# Patient Record
Sex: Female | Born: 1985
Health system: Southern US, Community
[De-identification: ages and names within clinical notes are randomized; demographics above are authoritative.]

## PROBLEM LIST (undated history)

## (undated) DIAGNOSIS — F419 Anxiety disorder, unspecified: Secondary | ICD-10-CM

## (undated) DIAGNOSIS — D509 Iron deficiency anemia, unspecified: Secondary | ICD-10-CM

## (undated) DIAGNOSIS — D649 Anemia, unspecified: Secondary | ICD-10-CM

## (undated) HISTORY — DX: Iron deficiency anemia, unspecified: D50.9

## (undated) HISTORY — PX: MOUTH SURGERY: SHX715

## (undated) HISTORY — DX: Anxiety disorder, unspecified: F41.9

---
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
## 2006-06-09 ENCOUNTER — Other Ambulatory Visit: Admission: RE | Admit: 2006-06-09 | Discharge: 2006-06-09 | Payer: Self-pay | Admitting: Gynecology

## 2007-06-04 LAB — CONVERTED CEMR LAB: Pap Smear: NORMAL

## 2007-06-24 ENCOUNTER — Other Ambulatory Visit: Admission: RE | Admit: 2007-06-24 | Discharge: 2007-06-24 | Payer: Self-pay | Admitting: Gynecology

## 2007-07-30 ENCOUNTER — Ambulatory Visit: Payer: Self-pay | Admitting: Family Medicine

## 2007-07-30 DIAGNOSIS — R51 Headache: Secondary | ICD-10-CM | POA: Insufficient documentation

## 2007-07-30 DIAGNOSIS — R519 Headache, unspecified: Secondary | ICD-10-CM | POA: Insufficient documentation

## 2007-07-30 DIAGNOSIS — J309 Allergic rhinitis, unspecified: Secondary | ICD-10-CM | POA: Insufficient documentation

## 2007-07-30 DIAGNOSIS — D509 Iron deficiency anemia, unspecified: Secondary | ICD-10-CM | POA: Insufficient documentation

## 2007-07-30 HISTORY — DX: Iron deficiency anemia, unspecified: D50.9

## 2007-08-30 ENCOUNTER — Ambulatory Visit: Payer: Self-pay | Admitting: Family Medicine

## 2007-08-30 DIAGNOSIS — G43009 Migraine without aura, not intractable, without status migrainosus: Secondary | ICD-10-CM | POA: Insufficient documentation

## 2011-12-16 ENCOUNTER — Encounter: Payer: Self-pay | Admitting: Obstetrics & Gynecology

## 2012-01-02 ENCOUNTER — Encounter: Payer: Self-pay | Admitting: Obstetrics & Gynecology

## 2012-05-08 ENCOUNTER — Observation Stay: Payer: Self-pay | Admitting: Obstetrics and Gynecology

## 2012-05-31 ENCOUNTER — Inpatient Hospital Stay: Payer: Self-pay

## 2012-05-31 LAB — CBC WITH DIFFERENTIAL/PLATELET
Eosinophil %: 0.9 %
HGB: 11.8 g/dL — ABNORMAL LOW (ref 12.0–16.0)
Lymphocyte #: 2.1 10*3/uL (ref 1.0–3.6)
Lymphocyte %: 17.5 %
Monocyte %: 7.5 %
Neutrophil %: 73.8 %
Platelet: 237 10*3/uL (ref 150–440)
RBC: 3.75 10*6/uL — ABNORMAL LOW (ref 3.80–5.20)
WBC: 11.9 10*3/uL — ABNORMAL HIGH (ref 3.6–11.0)

## 2014-01-12 ENCOUNTER — Observation Stay: Payer: Self-pay | Admitting: Obstetrics & Gynecology

## 2014-01-14 ENCOUNTER — Inpatient Hospital Stay: Payer: Self-pay

## 2014-01-14 LAB — CBC WITH DIFFERENTIAL/PLATELET
BASOS PCT: 0.2 %
Basophil #: 0 10*3/uL (ref 0.0–0.1)
EOS PCT: 0.8 %
Eosinophil #: 0.1 10*3/uL (ref 0.0–0.7)
HCT: 35.6 % (ref 35.0–47.0)
HGB: 11.9 g/dL — AB (ref 12.0–16.0)
LYMPHS PCT: 20.3 %
Lymphocyte #: 2.4 10*3/uL (ref 1.0–3.6)
MCH: 30 pg (ref 26.0–34.0)
MCHC: 33.6 g/dL (ref 32.0–36.0)
MCV: 89 fL (ref 80–100)
Monocyte #: 0.8 x10 3/mm (ref 0.2–0.9)
Monocyte %: 6.8 %
NEUTROS ABS: 8.6 10*3/uL — AB (ref 1.4–6.5)
Neutrophil %: 71.9 %
PLATELETS: 197 10*3/uL (ref 150–440)
RBC: 3.98 10*6/uL (ref 3.80–5.20)
RDW: 13.1 % (ref 11.5–14.5)
WBC: 12 10*3/uL — ABNORMAL HIGH (ref 3.6–11.0)

## 2014-01-15 LAB — HEMATOCRIT: HCT: 29.4 % — AB (ref 35.0–47.0)

## 2015-03-13 NOTE — H&P (Signed)
L&D Evaluation:  History Expanded:  HPI 29 yo G1P0 at 32 6/7 weeks w question of LOF, either spontaneous rupture of membranes or urinary incointinence.  No contractions or vaginal bleeding.   Presents with leaking fluid   Patient's Medical History No Chronic Illness   Patient's Surgical History none   Medications Pre Natal Vitamins   Allergies NKDA   Social History none   Family History Non-Contributory   ROS:  ROS All systems were reviewed.  HEENT, CNS, GI, GU, Respiratory, CV, Renal and Musculoskeletal systems were found to be normal.   Exam:  Vital Signs stable   General no apparent distress   Mental Status clear   Abdomen gravid, non-tender   Estimated Fetal Weight Average for gestational age   Edema no edema   Pelvic no external lesions, 3 cm.  Neg pool and Nitrazine   FHT normal rate with no decels   Ucx absent   Impression:  Impression Urinary incointinence most likely.   Plan:  Plan EFM/NST, Will monitor for leakage, contractions, labor.   Follow Up Appointment already scheduled   Electronic Signatures: Hoyt Koch (MD)  (Signed 12-Mar-15 21:58)  Authored: L&D Evaluation   Last Updated: 12-Mar-15 21:58 by Hoyt Koch (MD)

## 2015-03-13 NOTE — H&P (Signed)
L&D Evaluation:  History:   HPI 29 year old G1P0 with EDC=05/25/2012 by LMP=08/19/2011 presents to L&D at 40 6/7 weeks for IOL for postdates. Has occasional contraction and good FM. Denies LOF or VB. Prenatal care at [facility] remarkable for early [facility], 7wk ultrasound confirming dates, first trimester spotting, normal anatomy scan, remarkable pedal/ankle edema, and 50 # weight gain with pregnancy. [place] 7/26 estimates fetal weight at 4171 Gms and AFI=16 cm. 1hr GTT was 101. NSTs have been reactive. LABS: O POS, RI, VI, GBS negative.    Presents with IOL    Patient's Medical History No Chronic Illness    Patient's Surgical History none    Medications Pre Natal Vitamins    Allergies Sulfa, rash    Social History none    Family History Non-Contributory   ROS:   ROS All systems were reviewed.  HEENT, CNS, GI, GU, Respiratory, CV, Renal and Musculoskeletal systems were found to be normal.   Exam:   Vital Signs stable    Urine Protein not completed    General no apparent distress    Mental Status clear    Chest clear    Heart normal sinus rhythm, no murmur/gallop/rubs    Abdomen gravid, non-tender    Estimated Fetal Weight Large for gestational age, 9#    Fetal Position vtx    Edema 2+    Reflexes 2+    Pelvic 1/60-70%/-1/post/soft/friable    Mebranes Intact    FHT normal rate with no decels, 135-140 with accels to 150s to 160s, mod variability    Fetal Heart Rate 144    Ucx mild, frequent of varying strength and duration    Skin dry   Impression:   Impression IUP at 40 6/7 weeks for IOL   Plan:   Plan EFM/NST, monitor contractions and for cervical change, Cervidil ripening tonight, pitocin tomorrow. Discussed POM and risks of continued observation and risks of induction which include hyperstimulation, FITL, C-section, and failed induction. Patient wishes to proceed with POM.   Electronic Signatures: Karene Fry (CNM)  (Signed 29-Jul-13 21:40)  Authored:  L&D Evaluation   Last Updated: 29-Jul-13 21:40 by Karene Fry (CNM)

## 2015-03-13 NOTE — H&P (Signed)
L&D Evaluation:  History Expanded:  HPI 29 yo G2P1001 at 40 1/7 weeks in labor at 6 cms, she is GBS POS. No vaginal bleeding. NO ROM, O pos RUBI/VZI tdap goven 11/25/10 wants to BF and desires Mirena post delivery   Gravida 2   Term 1   PreTerm 0   Abortion 0   Living 1   Blood Type (Maternal) O positive   Group B Strep Results Maternal (Result >5wks must be treated as unknown) positive   Maternal HIV Negative   Maternal Syphilis Ab Nonreactive   Maternal Varicella Immune   Rubella Results (Maternal) immune   Maternal T-Dap Immune   [facility] 13-Jan-2014   Presents with contractions   Patient's Medical History No Chronic Illness   Patient's Surgical History none   Medications Pre Natal Vitamins   Allergies NKDA   Social History none   Family History Non-Contributory   ROS:  ROS All systems were reviewed.  HEENT, CNS, GI, GU, Respiratory, CV, Renal and Musculoskeletal systems were found to be normal.   Exam:  Vital Signs stable   General no apparent distress   Mental Status clear   Chest clear   Heart normal sinus rhythm   Abdomen gravid, non-tender   Estimated Fetal Weight Average for gestational age   Fetal Position v   Fundal Height term   Back no CVAT   Edema no edema   Reflexes 1+   Pelvic no external lesions, 3 cm.  Neg pool and Nitrazine   Mebranes Intact   FHT normal rate with no decels, CAT 1   Ucx regular   Ucx Frequency 3 min   Skin dry   Lymph no lymphadenopathy   Impression:  Impression active labor, Urinary incointinence most likely.   Plan:  Plan EFM/NST, antibiotics for GBBS prophylaxis, admit for labor   Follow Up Appointment already scheduled   Electronic Signatures: Erik Obey (MD)  (Signed 14-Mar-15 05:11)  Authored: L&D Evaluation   Last Updated: 14-Mar-15 05:11 by Erik Obey (MD)

## 2015-06-06 ENCOUNTER — Other Ambulatory Visit (HOSPITAL_COMMUNITY): Payer: Self-pay | Admitting: Obstetrics and Gynecology

## 2015-06-06 DIAGNOSIS — Z975 Presence of (intrauterine) contraceptive device: Secondary | ICD-10-CM

## 2015-06-06 DIAGNOSIS — R1011 Right upper quadrant pain: Secondary | ICD-10-CM

## 2015-06-14 ENCOUNTER — Ambulatory Visit (HOSPITAL_COMMUNITY)
Admission: RE | Admit: 2015-06-14 | Discharge: 2015-06-14 | Disposition: A | Discharge status: Home / Self Care | Payer: Managed Care, Other (non HMO) | Source: Ambulatory Visit | Attending: Obstetrics and Gynecology | Admitting: Obstetrics and Gynecology

## 2015-06-14 ENCOUNTER — Other Ambulatory Visit (HOSPITAL_COMMUNITY): Payer: Self-pay | Admitting: Obstetrics and Gynecology

## 2015-06-14 DIAGNOSIS — R1011 Right upper quadrant pain: Secondary | ICD-10-CM

## 2015-06-14 DIAGNOSIS — K8020 Calculus of gallbladder without cholecystitis without obstruction: Secondary | ICD-10-CM | POA: Diagnosis not present

## 2015-06-14 DIAGNOSIS — Z30431 Encounter for routine checking of intrauterine contraceptive device: Secondary | ICD-10-CM | POA: Insufficient documentation

## 2015-06-14 DIAGNOSIS — Z975 Presence of (intrauterine) contraceptive device: Secondary | ICD-10-CM

## 2015-06-19 ENCOUNTER — Ambulatory Visit (HOSPITAL_COMMUNITY): Payer: Self-pay

## 2015-06-22 ENCOUNTER — Ambulatory Visit (HOSPITAL_COMMUNITY): Payer: Self-pay

## 2015-07-11 ENCOUNTER — Other Ambulatory Visit: Payer: Self-pay | Admitting: General Surgery

## 2015-08-24 NOTE — Pre-Procedure Instructions (Addendum)
Sarah Lyons  08/24/2015      [id number] [place], [place] - [address] [place] [address] Phone: [phone] Fax: [phone]    Your procedure is scheduled on Wednesday, September 05, 2015  [facility] to [facility] Admitting at 6:30 A.M.  Call this number if you have problems the morning of surgery:  [phone]   Remember:  Do not eat food or drink liquids after midnight Tuesday, September 04, 2015  Take these medicines the morning of surgery with A SIP OF WATER: none  Stop taking Aspirin, vitamins and herbal medications such as Fish oil. Do not take any NSAIDs ie: Ibuprofen, Advil, Naproxen or any medication containing Aspirin; stop Wednesday, August 31, 2015   Do not wear jewelry, make-up or nail polish.  Do not wear lotions, powders, or perfumes.  You may not wear deodorant.  Do not shave 48 hours prior to surgery.   Do not bring valuables to the hospital.  [facility] is not responsible for any belongings or valuables.  Contacts, dentures or bridgework may not be worn into surgery.  Leave your suitcase in the car.  After surgery it may be brought to your room.  For patients admitted to the hospital, discharge time will be determined by your treatment team.  Patients discharged the day of surgery will not be allowed to drive home.   Name and phone number of your driver:    Special instructions:  [facility] - Preparing for Surgery  Before surgery, you can play an important role.  Because skin is not sterile, your skin needs to be as free of germs as possible.  You can reduce the number of germs on you skin by washing with CHG (chlorahexidine gluconate) soap before surgery.  CHG is an antiseptic cleaner which kills germs and bonds with the skin to continue killing germs even after washing.  Please DO NOT use if you have an allergy to CHG or antibacterial soaps.  If your skin becomes reddened/irritated stop using the CHG and  inform your nurse when you arrive at Short Stay.  Do not shave (including legs and underarms) for at least 48 hours prior to the first CHG shower.  You may shave your face.  Please follow these instructions carefully:   1.  Shower with CHG Soap the night before surgery and the morning of Surgery.  2.  If you choose to wash your hair, wash your hair first as usual with your normal shampoo.  3.  After you shampoo, rinse your hair and body thoroughly to remove the Shampoo.  4.  Use CHG as you would any other liquid soap.  You can apply chg directly  to the skin and wash gently with scrungie or a clean washcloth.  5.  Apply the CHG Soap to your body ONLY FROM THE NECK DOWN.  Do not use on open wounds or open sores.  Avoid contact with your eyes, ears, mouth and genitals (private parts).  Wash genitals (private parts) with your normal soap.  6.  Wash thoroughly, paying special attention to the area where your surgery will be performed.  7.  Thoroughly rinse your body with warm water from the neck down.  8.  DO NOT shower/wash with your normal soap after using and rinsing off the CHG Soap.  9.  Pat yourself dry with a clean towel.            10.  Wear clean  pajamas.            11.  Place clean sheets on your bed the night of your first shower and do not sleep with pets.  Day of Surgery  Do not apply any lotions/deodorants the morning of surgery.  Please wear clean clothes to the hospital/surgery center.  Please read over the following fact sheets that you were given. Pain Booklet, Coughing and Deep Breathing and Surgical Site Infection Prevention

## 2015-08-27 ENCOUNTER — Encounter (HOSPITAL_COMMUNITY): Payer: Self-pay

## 2015-08-27 ENCOUNTER — Encounter (HOSPITAL_COMMUNITY)
Admission: RE | Admit: 2015-08-27 | Discharge: 2015-08-27 | Disposition: A | Discharge status: Home / Self Care | Payer: Managed Care, Other (non HMO) | Source: Ambulatory Visit | Attending: General Surgery | Admitting: General Surgery

## 2015-08-27 DIAGNOSIS — Z01812 Encounter for preprocedural laboratory examination: Secondary | ICD-10-CM | POA: Insufficient documentation

## 2015-08-27 DIAGNOSIS — K8020 Calculus of gallbladder without cholecystitis without obstruction: Secondary | ICD-10-CM | POA: Diagnosis not present

## 2015-08-27 HISTORY — DX: Anemia, unspecified: D64.9

## 2015-08-27 LAB — HCG, SERUM, QUALITATIVE: Preg, Serum: NEGATIVE

## 2015-08-27 LAB — COMPREHENSIVE METABOLIC PANEL
ALBUMIN: 4.4 g/dL (ref 3.5–5.0)
ALK PHOS: 48 U/L (ref 38–126)
ALT: 38 U/L (ref 14–54)
AST: 23 U/L (ref 15–41)
Anion gap: 8 (ref 5–15)
BILIRUBIN TOTAL: 0.9 mg/dL (ref 0.3–1.2)
BUN: 12 mg/dL (ref 6–20)
CO2: 23 mmol/L (ref 22–32)
CREATININE: 0.81 mg/dL (ref 0.44–1.00)
Calcium: 9.5 mg/dL (ref 8.9–10.3)
Chloride: 107 mmol/L (ref 101–111)
GFR calc Af Amer: >60 mL/min (ref >60)
GFR calc non Af Amer: >60 mL/min (ref >60)
Glucose, Bld: 92 mg/dL (ref 65–99)
Potassium: 4.4 mmol/L (ref 3.5–5.1)
Sodium: 138 mmol/L (ref 135–145)
TOTAL PROTEIN: 7.4 g/dL (ref 6.5–8.1)

## 2015-08-27 LAB — CBC
HEMATOCRIT: 40.7 % (ref 36.0–46.0)
Hemoglobin: 13.7 g/dL (ref 12.0–15.0)
MCH: 28.8 pg (ref 26.0–34.0)
MCHC: 33.7 g/dL (ref 30.0–36.0)
MCV: 85.7 fL (ref 78.0–100.0)
Platelets: 193 10*3/uL (ref 150–400)
RBC: 4.75 MIL/uL (ref 3.87–5.11)
RDW: 11.9 % (ref 11.5–15.5)
WBC: 5.4 10*3/uL (ref 4.0–10.5)

## 2015-09-04 MED ORDER — CHLORHEXIDINE GLUCONATE 4 % EX LIQD: 1.0000 "application " | Freq: Once | CUTANEOUS | Status: DC

## 2015-09-04 MED ORDER — CEFAZOLIN SODIUM-DEXTROSE 2-3 GM-% IV SOLR
2.0000 g | INTRAVENOUS | Status: AC
  Administered 2015-09-05: 2 g via INTRAVENOUS
  Filled 2015-09-04: qty 50

## 2015-09-05 ENCOUNTER — Ambulatory Visit (HOSPITAL_COMMUNITY): Payer: Managed Care, Other (non HMO)

## 2015-09-05 ENCOUNTER — Ambulatory Visit (HOSPITAL_COMMUNITY)
Admission: RE | Admit: 2015-09-05 | Discharge: 2015-09-06 | Disposition: A | Discharge status: Home / Self Care | Payer: Managed Care, Other (non HMO) | Source: Ambulatory Visit | Attending: General Surgery | Admitting: General Surgery

## 2015-09-05 ENCOUNTER — Encounter (HOSPITAL_COMMUNITY)
Admission: RE | Disposition: A | Discharge status: Home / Self Care | Payer: Self-pay | Source: Ambulatory Visit | Attending: General Surgery

## 2015-09-05 ENCOUNTER — Encounter (HOSPITAL_COMMUNITY): Payer: Self-pay | Admitting: Surgery

## 2015-09-05 ENCOUNTER — Ambulatory Visit (HOSPITAL_COMMUNITY): Payer: Managed Care, Other (non HMO) | Admitting: Certified Registered Nurse Anesthetist

## 2015-09-05 DIAGNOSIS — K8010 Calculus of gallbladder with chronic cholecystitis without obstruction: Secondary | ICD-10-CM | POA: Diagnosis not present

## 2015-09-05 DIAGNOSIS — K8020 Calculus of gallbladder without cholecystitis without obstruction: Secondary | ICD-10-CM

## 2015-09-05 HISTORY — PX: CHOLECYSTECTOMY: SHX55

## 2015-09-05 SURGERY — LAPAROSCOPIC CHOLECYSTECTOMY WITH INTRAOPERATIVE CHOLANGIOGRAM
Anesthesia: General | Site: Abdomen

## 2015-09-05 MED ORDER — GLYCOPYRROLATE 0.2 MG/ML IJ SOLN
INTRAMUSCULAR | Status: AC
  Filled 2015-09-05: qty 2

## 2015-09-05 MED ORDER — ONDANSETRON 4 MG PO TBDP: 4.0000 mg | ORAL_TABLET | Freq: Four times a day (QID) | ORAL | Status: DC | PRN

## 2015-09-05 MED ORDER — 0.9 % SODIUM CHLORIDE (POUR BTL) OPTIME
TOPICAL | Status: DC | PRN
  Administered 2015-09-05: 1000 mL

## 2015-09-05 MED ORDER — ROCURONIUM BROMIDE 50 MG/5ML IV SOLN
INTRAVENOUS | Status: AC
  Filled 2015-09-05: qty 1

## 2015-09-05 MED ORDER — EPHEDRINE SULFATE 50 MG/ML IJ SOLN
INTRAMUSCULAR | Status: AC
  Filled 2015-09-05: qty 1

## 2015-09-05 MED ORDER — ONDANSETRON HCL 4 MG/2ML IJ SOLN: 4.0000 mg | Freq: Once | INTRAMUSCULAR | Status: DC | PRN

## 2015-09-05 MED ORDER — OXYCODONE HCL 5 MG/5ML PO SOLN: 5.0000 mg | Freq: Once | ORAL | Status: DC | PRN

## 2015-09-05 MED ORDER — ONDANSETRON HCL 4 MG/2ML IJ SOLN
INTRAMUSCULAR | Status: DC | PRN
  Administered 2015-09-05: 4 mg via INTRAVENOUS

## 2015-09-05 MED ORDER — SODIUM CHLORIDE 0.9 % IR SOLN
Status: DC | PRN
  Administered 2015-09-05: 1000 mL

## 2015-09-05 MED ORDER — DEXAMETHASONE SODIUM PHOSPHATE 4 MG/ML IJ SOLN
INTRAMUSCULAR | Status: AC
  Filled 2015-09-05: qty 1

## 2015-09-05 MED ORDER — KCL IN DEXTROSE-NACL 20-5-0.9 MEQ/L-%-% IV SOLN
INTRAVENOUS | Status: DC
  Administered 2015-09-05: 14:00:00 via INTRAVENOUS
  Filled 2015-09-05 (×2): qty 1000

## 2015-09-05 MED ORDER — MIDAZOLAM HCL 5 MG/5ML IJ SOLN
INTRAMUSCULAR | Status: DC | PRN
  Administered 2015-09-05: 2 mg via INTRAVENOUS

## 2015-09-05 MED ORDER — DEXAMETHASONE SODIUM PHOSPHATE 4 MG/ML IJ SOLN
INTRAMUSCULAR | Status: DC | PRN
  Administered 2015-09-05: 4 mg via INTRAVENOUS

## 2015-09-05 MED ORDER — PHENYLEPHRINE 40 MCG/ML (10ML) SYRINGE FOR IV PUSH (FOR BLOOD PRESSURE SUPPORT)
PREFILLED_SYRINGE | INTRAVENOUS | Status: AC
  Filled 2015-09-05: qty 10

## 2015-09-05 MED ORDER — OXYCODONE-ACETAMINOPHEN 5-325 MG PO TABS
ORAL_TABLET | ORAL | Status: AC
  Administered 2015-09-05: 2 via ORAL
  Filled 2015-09-05: qty 2

## 2015-09-05 MED ORDER — PROPOFOL 10 MG/ML IV BOLUS
INTRAVENOUS | Status: DC | PRN
  Administered 2015-09-05: 200 mg via INTRAVENOUS

## 2015-09-05 MED ORDER — OXYCODONE-ACETAMINOPHEN 5-325 MG PO TABS
1.0000 | ORAL_TABLET | ORAL | Status: DC | PRN
  Administered 2015-09-05 – 2015-09-06 (×5): 2 via ORAL
  Filled 2015-09-05 (×4): qty 2

## 2015-09-05 MED ORDER — ONDANSETRON HCL 4 MG/2ML IJ SOLN
INTRAMUSCULAR | Status: AC
  Filled 2015-09-05: qty 2

## 2015-09-05 MED ORDER — ACETAMINOPHEN 160 MG/5ML PO SOLN
325.0000 mg | ORAL | Status: DC | PRN
  Filled 2015-09-05: qty 20.3

## 2015-09-05 MED ORDER — OXYCODONE HCL 5 MG PO TABS
ORAL_TABLET | ORAL | Status: AC
  Filled 2015-09-05: qty 1

## 2015-09-05 MED ORDER — NEOSTIGMINE METHYLSULFATE 10 MG/10ML IV SOLN
INTRAVENOUS | Status: DC | PRN
  Administered 2015-09-05: 3 mg via INTRAVENOUS

## 2015-09-05 MED ORDER — FENTANYL CITRATE (PF) 100 MCG/2ML IJ SOLN: 25.0000 ug | INTRAMUSCULAR | Status: DC | PRN

## 2015-09-05 MED ORDER — MIDAZOLAM HCL 2 MG/2ML IJ SOLN
INTRAMUSCULAR | Status: AC
  Filled 2015-09-05: qty 4

## 2015-09-05 MED ORDER — OXYCODONE HCL 5 MG PO TABS: 5.0000 mg | ORAL_TABLET | Freq: Once | ORAL | Status: DC | PRN

## 2015-09-05 MED ORDER — GLYCOPYRROLATE 0.2 MG/ML IJ SOLN
INTRAMUSCULAR | Status: DC | PRN
  Administered 2015-09-05: 0.4 mg via INTRAVENOUS

## 2015-09-05 MED ORDER — MORPHINE SULFATE (PF) 2 MG/ML IV SOLN
INTRAVENOUS | Status: AC
  Administered 2015-09-05: 2 mg via INTRAVENOUS
  Filled 2015-09-05: qty 1

## 2015-09-05 MED ORDER — ONDANSETRON HCL 4 MG/2ML IJ SOLN
4.0000 mg | Freq: Four times a day (QID) | INTRAMUSCULAR | Status: DC | PRN
  Administered 2015-09-05: 4 mg via INTRAVENOUS
  Filled 2015-09-05: qty 2

## 2015-09-05 MED ORDER — EPHEDRINE SULFATE 50 MG/ML IJ SOLN
INTRAMUSCULAR | Status: DC | PRN
  Administered 2015-09-05: 10 mg via INTRAVENOUS

## 2015-09-05 MED ORDER — LACTATED RINGERS IV SOLN
INTRAVENOUS | Status: DC | PRN
  Administered 2015-09-05: 08:00:00 via INTRAVENOUS

## 2015-09-05 MED ORDER — MORPHINE SULFATE (PF) 2 MG/ML IV SOLN
1.0000 mg | INTRAVENOUS | Status: DC | PRN
  Administered 2015-09-05 (×2): 2 mg via INTRAVENOUS
  Filled 2015-09-05: qty 1

## 2015-09-05 MED ORDER — BUPIVACAINE-EPINEPHRINE (PF) 0.25% -1:200000 IJ SOLN
INTRAMUSCULAR | Status: AC
  Filled 2015-09-05: qty 30

## 2015-09-05 MED ORDER — LIDOCAINE HCL (CARDIAC) 20 MG/ML IV SOLN
INTRAVENOUS | Status: DC | PRN
  Administered 2015-09-05: 40 mg via INTRAVENOUS
  Administered 2015-09-05: 60 mg via INTRAVENOUS

## 2015-09-05 MED ORDER — HYDROMORPHONE HCL 1 MG/ML IJ SOLN
0.2500 mg | INTRAMUSCULAR | Status: DC | PRN
  Administered 2015-09-05 (×4): 0.5 mg via INTRAVENOUS

## 2015-09-05 MED ORDER — HYDROMORPHONE HCL 1 MG/ML IJ SOLN
INTRAMUSCULAR | Status: AC
  Administered 2015-09-05: 0.5 mg via INTRAVENOUS
  Filled 2015-09-05: qty 1

## 2015-09-05 MED ORDER — OXYCODONE HCL 5 MG/5ML PO SOLN: 5.0000 mg | Freq: Once | ORAL | Status: AC | PRN

## 2015-09-05 MED ORDER — LIDOCAINE HCL (CARDIAC) 20 MG/ML IV SOLN
INTRAVENOUS | Status: AC
  Filled 2015-09-05: qty 5

## 2015-09-05 MED ORDER — BUPIVACAINE-EPINEPHRINE 0.25% -1:200000 IJ SOLN
INTRAMUSCULAR | Status: DC | PRN
  Administered 2015-09-05: 20 mL

## 2015-09-05 MED ORDER — HEPARIN SODIUM (PORCINE) 5000 UNIT/ML IJ SOLN
5000.0000 [IU] | Freq: Three times a day (TID) | INTRAMUSCULAR | Status: DC
  Administered 2015-09-06: 5000 [IU] via SUBCUTANEOUS
  Filled 2015-09-05: qty 1

## 2015-09-05 MED ORDER — ACETAMINOPHEN 325 MG PO TABS: 325.0000 mg | ORAL_TABLET | ORAL | Status: DC | PRN

## 2015-09-05 MED ORDER — SODIUM CHLORIDE 0.9 % IV SOLN
INTRAVENOUS | Status: DC | PRN
  Administered 2015-09-05: 2.5 mL

## 2015-09-05 MED ORDER — SODIUM CHLORIDE 0.9 % IJ SOLN
INTRAMUSCULAR | Status: AC
  Filled 2015-09-05: qty 10

## 2015-09-05 MED ORDER — FENTANYL CITRATE (PF) 100 MCG/2ML IJ SOLN
INTRAMUSCULAR | Status: DC | PRN
  Administered 2015-09-05: 50 ug via INTRAVENOUS
  Administered 2015-09-05: 100 ug via INTRAVENOUS

## 2015-09-05 MED ORDER — ROCURONIUM BROMIDE 100 MG/10ML IV SOLN
INTRAVENOUS | Status: DC | PRN
  Administered 2015-09-05: 40 mg via INTRAVENOUS

## 2015-09-05 MED ORDER — OXYCODONE HCL 5 MG PO TABS
5.0000 mg | ORAL_TABLET | Freq: Once | ORAL | Status: AC | PRN
  Administered 2015-09-05: 5 mg via ORAL

## 2015-09-05 MED ORDER — FENTANYL CITRATE (PF) 250 MCG/5ML IJ SOLN
INTRAMUSCULAR | Status: AC
  Filled 2015-09-05: qty 5

## 2015-09-05 MED ORDER — OXYCODONE-ACETAMINOPHEN 5-325 MG PO TABS: 1.0000 | ORAL_TABLET | ORAL | Status: DC | PRN

## 2015-09-05 MED ORDER — PANTOPRAZOLE SODIUM 40 MG IV SOLR
40.0000 mg | Freq: Every day | INTRAVENOUS | Status: DC
  Administered 2015-09-05: 40 mg via INTRAVENOUS
  Filled 2015-09-05: qty 40

## 2015-09-05 MED ORDER — SUCCINYLCHOLINE CHLORIDE 20 MG/ML IJ SOLN
INTRAMUSCULAR | Status: AC
  Filled 2015-09-05: qty 1

## 2015-09-05 MED ORDER — PROPOFOL 10 MG/ML IV BOLUS
INTRAVENOUS | Status: AC
  Filled 2015-09-05: qty 20

## 2015-09-05 MED ORDER — NEOSTIGMINE METHYLSULFATE 10 MG/10ML IV SOLN
INTRAVENOUS | Status: AC
  Filled 2015-09-05: qty 1

## 2015-09-05 SURGICAL SUPPLY — 36 items
APPLIER CLIP 5 13 M/L LIGAMAX5 (MISCELLANEOUS) ×2
APR CLP MED LRG 5 ANG JAW (MISCELLANEOUS) ×1
BAG SPEC RTRVL LRG 6X4 10 (ENDOMECHANICALS) ×1
BLADE SURG ROTATE 9660 (MISCELLANEOUS) IMPLANT
CANISTER SUCTION 2500CC (MISCELLANEOUS) ×2 IMPLANT
CATH REDDICK CHOLANGI 4FR 50CM (CATHETERS) ×2 IMPLANT
CHLORAPREP W/TINT 26ML (MISCELLANEOUS) ×2 IMPLANT
CLIP APPLIE 5 13 M/L LIGAMAX5 (MISCELLANEOUS) ×1 IMPLANT
COVER MAYO STAND STRL (DRAPES) ×2 IMPLANT
COVER SURGICAL LIGHT HANDLE (MISCELLANEOUS) ×2 IMPLANT
DRAPE C-ARM 42X72 X-RAY (DRAPES) ×2 IMPLANT
ELECT REM PT RETURN 9FT ADLT (ELECTROSURGICAL) ×2
ELECTRODE REM PT RTRN 9FT ADLT (ELECTROSURGICAL) ×1 IMPLANT
GLOVE BIO SURGEON STRL SZ7.5 (GLOVE) ×3 IMPLANT
GLOVE BIOGEL PI IND STRL 7.5 (GLOVE) IMPLANT
GLOVE BIOGEL PI INDICATOR 7.5 (GLOVE) ×1
GOWN STRL REUS W/ TWL LRG LVL3 (GOWN DISPOSABLE) ×3 IMPLANT
GOWN STRL REUS W/TWL LRG LVL3 (GOWN DISPOSABLE) ×6
IV CATH 14GX2 1/4 (CATHETERS) ×2 IMPLANT
KIT BASIN OR (CUSTOM PROCEDURE TRAY) ×2 IMPLANT
KIT ROOM TURNOVER OR (KITS) ×2 IMPLANT
LIQUID BAND (GAUZE/BANDAGES/DRESSINGS) ×2 IMPLANT
NS IRRIG 1000ML POUR BTL (IV SOLUTION) ×2 IMPLANT
PAD ARMBOARD 7.5X6 YLW CONV (MISCELLANEOUS) ×2 IMPLANT
POUCH SPECIMEN RETRIEVAL 10MM (ENDOMECHANICALS) ×2 IMPLANT
SCISSORS LAP 5X35 DISP (ENDOMECHANICALS) ×2 IMPLANT
SET IRRIG TUBING LAPAROSCOPIC (IRRIGATION / IRRIGATOR) ×2 IMPLANT
SLEEVE ENDOPATH XCEL 5M (ENDOMECHANICALS) ×4 IMPLANT
SPECIMEN JAR SMALL (MISCELLANEOUS) ×2 IMPLANT
SUT MNCRL AB 4-0 PS2 18 (SUTURE) ×2 IMPLANT
TOWEL OR 17X24 6PK STRL BLUE (TOWEL DISPOSABLE) ×2 IMPLANT
TOWEL OR 17X26 10 PK STRL BLUE (TOWEL DISPOSABLE) ×2 IMPLANT
TRAY LAPAROSCOPIC MC (CUSTOM PROCEDURE TRAY) ×2 IMPLANT
TROCAR XCEL BLUNT TIP 100MML (ENDOMECHANICALS) ×2 IMPLANT
TROCAR XCEL NON-BLD 5MMX100MML (ENDOMECHANICALS) ×2 IMPLANT
TUBING INSUFFLATION (TUBING) ×2 IMPLANT

## 2015-09-05 NOTE — Transfer of Care (Signed)
Immediate Anesthesia Transfer of Care Note  Patient: Sarah Lyons  Procedure(s) Performed: Procedure(s): LAPAROSCOPIC CHOLECYSTECTOMY WITH INTRAOPERATIVE CHOLANGIOGRAM (N/A)  Patient Location: PACU  Anesthesia Type:General  Level of Consciousness: awake, alert  and oriented  Airway & Oxygen Therapy: Patient Spontanous Breathing and Patient connected to nasal cannula oxygen  Post-op Assessment: Report given to RN, Post -op Vital signs reviewed and stable and Patient moving all extremities X 4  Post vital signs: Reviewed and stable  Last Vitals:  Filed Vitals:   09/05/15 0733  BP: 108/54  Pulse: 64  Temp: 36.7 C  Resp: 18    Complications: No apparent anesthesia complications

## 2015-09-05 NOTE — H&P (Signed)
Sarah Lyons. Stecker 07/11/2015 12:00 PM Location: [facility] Patient #: [id number] DOB: 12/20/1985 Married / Language: English / Race: White Female   History of Present Illness Sarah Lyons S. Sarah Starks MD; 07/11/2015 1:27 PM) Patient words: gallbladder.  The patient is a 29 year old female who presents with abdominal pain. We are asked to see the patient in consultation by Dr. Brien Lyons to evaluate her for gallstones. The patient is a 29 year old white female who has been experiencing right upper quadrant pain since late 2014 during her last pregnancy. She describes the pain as constant. She has had some relief from the pain recently. She does feel that eating fatty and greasy foods makes the pain worse. She denies any nausea or vomiting. She recently underwent an ultrasound which did show stones in her gallbladder but no gallbladder wall thickening or ductal dilatation.   Other Problems Sarah Lyons, [name]; 07/11/2015 12:01 PM) Cholelithiasis  Past Surgical History Sarah Lyons, [name]; 07/11/2015 12:01 PM) Oral Surgery  Diagnostic Studies History Sarah Lyons, [place]; 07/11/2015 12:01 PM) Colonoscopy never Mammogram never Pap Smear 1-5 years ago  Allergies Sarah Lyons, [name]; 07/11/2015 12:01 PM) Sarah Lyons *DERMATOLOGICALS*  Medication History Sarah Lyons, [place]; 07/11/2015 12:01 PM) No Current Medications Medications Reconciled  Social History Sarah Lyons, [name]; 07/11/2015 12:01 PM) Alcohol use Moderate alcohol use. Caffeine use Carbonated beverages, Coffee. No drug use Tobacco use Never smoker.  Family History Sarah Lyons, [place]; 07/11/2015 12:01 PM) Colon Polyps Sarah Lyons. Depression Sarah Lyons. Hypertension Sarah Lyons. Prostate Cancer Sarah Lyons.  Pregnancy / Birth History Sarah Lyons, [place]; 07/11/2015 12:01 PM) Age at menarche 17 years. Contraceptive History Intrauterine device, Oral contraceptives. Gravida 2 Irregular periods Maternal age 81-30 Para 2    Review of  Systems Sarah Lyons [name]; 07/11/2015 12:01 PM) General Not Present- Appetite Loss, Chills, Fatigue, Fever, Night Sweats, Weight Gain and Weight Loss. Skin Present- Change in Wart/Mole. Not Present- Dryness, Hives, Jaundice, New Lesions, Non-Healing Wounds, Rash and Ulcer. HEENT Present- Oral Ulcers and Seasonal Allergies. Not Present- Earache, Hearing Loss, Hoarseness, Nose Bleed, Ringing in the Ears, Sinus Pain, Sore Throat, Visual Disturbances, Wears glasses/contact lenses and Yellow Eyes. Breast Not Present- Breast Mass, Breast Pain, Nipple Discharge and Skin Changes. Cardiovascular Not Present- Chest Pain, Difficulty Breathing Lying Down, Leg Cramps, Palpitations, Rapid Heart Rate, Shortness of Breath and Swelling of Extremities. Gastrointestinal Present- Abdominal Pain. Not Present- Bloating, Bloody Stool, Change in Bowel Habits, Chronic diarrhea, Constipation, Difficulty Swallowing, Excessive gas, Gets full quickly at meals, Hemorrhoids, Indigestion, Nausea, Rectal Pain and Vomiting. Female Genitourinary Not Present- Frequency, Nocturia, Painful Urination, Pelvic Pain and Urgency. Musculoskeletal Present- Muscle Pain. Not Present- Back Pain, Joint Pain, Joint Stiffness, Muscle Weakness and Swelling of Extremities. Neurological Not Present- Decreased Memory, Fainting, Headaches, Numbness, Seizures, Tingling, Tremor, Trouble walking and Weakness. Psychiatric Not Present- Anxiety, Bipolar, Change in Sleep Pattern, Depression, Fearful and Frequent crying. Endocrine Not Present- Cold Intolerance, Excessive Hunger, Hair Changes, Heat Intolerance, Hot flashes and New Diabetes. Hematology Not Present- Easy Bruising, Excessive bleeding, Gland problems, HIV and Persistent Infections.  Vitals Sarah Lyons [name]; 07/11/2015 12:02 PM) 07/11/2015 12:01 PM Weight: 158 lb Height: 63in Body Surface Area: 1.78 m Body Mass Index: 27.99 kg/m  Temp.: 97.71F(Temporal)  Pulse: 73 (Regular)  BP: 122/78  (Sitting, Left Arm, Standard)     Physical Exam Sarah Lyons S. Sarah Starks MD; 07/11/2015 1:27 PM) General Mental Status-Alert. General Appearance-Consistent with stated age. Hydration-Well hydrated. Voice-Normal.  Head and Neck Head-normocephalic, atraumatic with no lesions or palpable masses. Trachea-midline. Thyroid Gland Characteristics -  normal size and consistency.  Eye Eyeball - Bilateral-Extraocular movements intact. Sclera/Conjunctiva - Bilateral-No scleral icterus.  Chest and Lung Exam Chest and lung exam reveals -quiet, even and easy respiratory effort with no use of accessory muscles and on auscultation, normal breath sounds, no adventitious sounds and normal vocal resonance. Inspection Chest Wall - Normal. Back - normal.  Cardiovascular Cardiovascular examination reveals -normal heart sounds, regular rate and rhythm with no murmurs and normal pedal pulses bilaterally.  Abdomen Inspection Inspection of the abdomen reveals - No Hernias. Skin - Scar - no surgical scars. Palpation/Percussion Palpation and Percussion of the abdomen reveal - Soft, Non Tender, No Rebound tenderness, No Rigidity (guarding) and No hepatosplenomegaly. Auscultation Auscultation of the abdomen reveals - Bowel sounds normal.  Neurologic Neurologic evaluation reveals -alert and oriented x 3 with no impairment of recent or remote memory. Mental Status-Normal.  Musculoskeletal Normal Exam - Left-Upper Extremity Strength Normal and Lower Extremity Strength Normal. Normal Exam - Right-Upper Extremity Strength Normal and Lower Extremity Strength Normal.  Lymphatic Head & Neck  General Head & Neck Lymphatics: Bilateral - Description - Normal. Axillary  General Axillary Region: Bilateral - Description - Normal. Tenderness - Non Tender. Femoral & Inguinal  Generalized Femoral & Inguinal Lymphatics: Bilateral - Description - Normal. Tenderness - Non Tender.    Assessment  & Plan Sarah Lyons S. Sarah Starks MD; 07/11/2015 12:21 PM) GALLSTONES (K80.20) Impression: The patient appears to have symptomatic gallstones. Because of the risk of further painful episodes and possible pancreatitis I think she would benefit from having her gallbladder removed. She would also like to have this done. I have discussed with her in detail the risks and benefits of the operation to do this as well as some of the technical aspects and she understands and wishes to proceed Current Plans Pt Education - Gallstones: discussed with patient and provided information.   Signed by Luella Cook, MD (07/11/2015 1:28 PM)

## 2015-09-05 NOTE — Anesthesia Postprocedure Evaluation (Signed)
  Anesthesia Post-op Note  Patient: Sarah Lyons  Procedure(s) Performed: Procedure(s): LAPAROSCOPIC CHOLECYSTECTOMY WITH INTRAOPERATIVE CHOLANGIOGRAM (N/A)  Patient Location: PACU  Anesthesia Type:General  Level of Consciousness: awake  Airway and Oxygen Therapy: Patient Spontanous Breathing  Post-op Pain: mild  Post-op Assessment: Post-op Vital signs reviewed, Patient's Cardiovascular Status Stable, Respiratory Function Stable, Patent Airway, No signs of Nausea or vomiting and Pain level controlled              Post-op Vital Signs: Reviewed and stable  Last Vitals:  Filed Vitals:   09/05/15 1158  BP: 112/45  Pulse: 70  Temp: 36.6 C  Resp: 18    Complications: No apparent anesthesia complications

## 2015-09-05 NOTE — Op Note (Signed)
09/05/2015  9:30 AM  PATIENT:  Sarah Lyons  29 y.o. female  PRE-OPERATIVE DIAGNOSIS:  GALLSTONES  POST-OPERATIVE DIAGNOSIS:  GALLSTONES  PROCEDURE:  Procedure(s): LAPAROSCOPIC CHOLECYSTECTOMY WITH INTRAOPERATIVE CHOLANGIOGRAM (N/A)  SURGEON:  Surgeon(s) and Role:    * Jovita Kussmaul, MD - Primary  PHYSICIAN ASSISTANT:   ASSISTANTS: Sharyn Dross, RNFA   ANESTHESIA:   general  EBL:     BLOOD ADMINISTERED:none  DRAINS: none   LOCAL MEDICATIONS USED:  MARCAINE     SPECIMEN:  Source of Specimen:  gallbladder  DISPOSITION OF SPECIMEN:  PATHOLOGY  COUNTS:  YES  TOURNIQUET:  * No tourniquets in log *  DICTATION: .Dragon Dictation   Procedure: After informed consent was obtained the patient was brought to the operating room and placed in the supine position on the operating room table. After adequate induction of general anesthesia the patient's abdomen was prepped with ChloraPrep allowed to dry and draped in usual sterile manner. The area below the umbilicus was infiltrated with quarter percent  Marcaine. A small incision was made with a 15 blade knife. The incision was carried down through the subcutaneous tissue bluntly with a hemostat and [employer] retractors. The linea alba was identified. The linea alba was incised with a 15 blade knife and each side was grasped with Coker clamps. The preperitoneal space was then probed with a hemostat until the peritoneum was opened and access was gained to the abdominal cavity. A 0 Vicryl pursestring stitch was placed in the fascia surrounding the opening. A Hassan cannula was then placed through the opening and anchored in place with the previously placed Vicryl purse string stitch. The abdomen was insufflated with carbon dioxide without difficulty. A laparoscope was inserted through the [facility] cannula in the right upper quadrant was inspected. Next the epigastric region was infiltrated with % Marcaine. A small incision was made with a 15  blade knife. A 5 mm port was placed bluntly through this incision into the abdominal cavity under direct vision. Next 2 sites were chosen laterally on the right side of the abdomen for placement of 5 mm ports. Each of these areas was infiltrated with quarter percent Marcaine. Small stab incisions were made with a 15 blade knife. 5 mm ports were then placed bluntly through these incisions into the abdominal cavity under direct vision without difficulty. A blunt grasper was placed through the lateralmost 5 mm port and used to grasp the dome of the gallbladder and elevated anteriorly and superiorly. Another blunt grasper was placed through the other 5 mm port and used to retract the body and neck of the gallbladder. A dissector was placed through the epigastric port and using the electrocautery the peritoneal reflection at the gallbladder neck was opened. Blunt dissection was then carried out in this area until the gallbladder neck-cystic duct junction was readily identified and a good window was created. A single clip was placed on the gallbladder neck. A small  ductotomy was made just below the clip with laparoscopic scissors. A 14-gauge Angiocath was then placed through the anterior abdominal wall under direct vision. A Reddick cholangiogram catheter was then placed through the Angiocath and flushed. The catheter was then placed in the cystic duct and anchored in place with a clip. A cholangiogram was obtained that showed no filling defects good emptying into the duodenum an adequate length on the cystic duct. The anchoring clip and catheters were then removed from the patient. 3 clips were placed proximally on the cystic duct and  the duct was divided between the 2 sets of clips. Posterior to this the cystic artery was identified and again dissected bluntly in a circumferential manner until a good window  was created. 2 clips were placed proximally and one distally on the artery and the artery was divided between  the 2 sets of clips. Next a laparoscopic hook cautery device was used to separate the gallbladder from the liver bed. Prior to completely detaching the gallbladder from the liver bed the liver bed was inspected and several small bleeding points were coagulated with the electrocautery until the area was completely hemostatic. The gallbladder was then detached the rest of it from the liver bed without difficulty. A laparoscopic bag was inserted through the hassan port. The laparoscope was moved to the epigastric port. The gallbladder was placed within the bag and the bag was sealed.  The bag with the gallbladder was then removed with the [facility] cannula through the infraumbilical port without difficulty. The fascial defect was then closed with the previously placed Vicryl pursestring stitch as well as with another figure-of-eight 0 Vicryl stitch. The liver bed was inspected again and found to be hemostatic. The abdomen was irrigated with copious amounts of saline until the effluent was clear. The ports were then removed under direct vision without difficulty and were found to be hemostatic. The gas was allowed to escape. The skin incisions were all closed with interrupted 4-0 Monocryl subcuticular stitches. Dermabond dressings were applied. The patient tolerated the procedure well. At the end of the case all needle sponge and instrument counts were correct. The patient was then awakened and taken to recovery in stable condition  PLAN OF CARE: Discharge to home after PACU  PATIENT DISPOSITION:  PACU - hemodynamically stable.   Delay start of Pharmacological VTE agent (>24hrs) due to surgical blood loss or risk of bleeding: not applicable

## 2015-09-05 NOTE — Interval H&P Note (Signed)
History and Physical Interval Note:  09/05/2015 8:22 AM  Sarah Lyons  has presented today for surgery, with the diagnosis of GALLSTONES  The various methods of treatment have been discussed with the patient and family. After consideration of risks, benefits and other options for treatment, the patient has consented to  Procedure(s): LAPAROSCOPIC CHOLECYSTECTOMY WITH INTRAOPERATIVE CHOLANGIOGRAM (N/A) as a surgical intervention .  The patient's history has been reviewed, patient examined, no change in status, stable for surgery.  I have reviewed the patient's chart and labs.  Questions were answered to the patient's satisfaction.     TOTH III,Kelbie Moro S

## 2015-09-05 NOTE — Anesthesia Preprocedure Evaluation (Addendum)
Anesthesia Evaluation  Patient identified by MRN, date of birth, ID band Patient awake    Reviewed: Allergy & Precautions, NPO status , Patient's Chart, lab work & pertinent test results  History of Anesthesia Complications Negative for: history of anesthetic complications  Airway Mallampati: I  TM Distance: >3 FB Neck ROM: Full    Dental  (+) Teeth Intact   Pulmonary neg pulmonary ROS,    breath sounds clear to auscultation       Cardiovascular negative cardio ROS   Rhythm:Regular     Neuro/Psych  Headaches, negative psych ROS   GI/Hepatic negative GI ROS, Neg liver ROS,   Endo/Other  negative endocrine ROS  Renal/GU negative Renal ROS     Musculoskeletal negative musculoskeletal ROS (+)   Abdominal   Peds  Hematology negative hematology ROS (+)   Anesthesia Other Findings   Reproductive/Obstetrics                            Anesthesia Physical Anesthesia Plan  ASA: I  Anesthesia Plan: General   Post-op Pain Management:    Induction: Intravenous  Airway Management Planned: Oral ETT  Additional Equipment: None  Intra-op Plan:   Post-operative Plan: Extubation in OR  Informed Consent: I have reviewed the patients History and Physical, chart, labs and discussed the procedure including the risks, benefits and alternatives for the proposed anesthesia with the patient or authorized representative who has indicated his/her understanding and acceptance.   Dental advisory given  Plan Discussed with: CRNA and Surgeon  Anesthesia Plan Comments:        Anesthesia Quick Evaluation

## 2015-09-05 NOTE — Anesthesia Procedure Notes (Signed)
Procedure Name: Intubation Date/Time: 09/05/2015 8:39 AM Performed by: Kyung Rudd Pre-anesthesia Checklist: Patient identified, Emergency Drugs available, Suction available, Patient being monitored and Timeout performed Patient Re-evaluated:Patient Re-evaluated prior to inductionOxygen Delivery Method: Circle system utilized Preoxygenation: Pre-oxygenation with 100% oxygen Intubation Type: IV induction Ventilation: Mask ventilation without difficulty Laryngoscope Size: Mac and 3 Grade View: Grade I Tube type: Oral Tube size: 7.0 mm Number of attempts: 1 Airway Equipment and Method: Stylet and LTA kit utilized Placement Confirmation: ETT inserted through vocal cords under direct vision,  positive ETCO2 and breath sounds checked- equal and bilateral Secured at: 21 cm Tube secured with: Tape Dental Injury: Teeth and Oropharynx as per pre-operative assessment

## 2015-09-06 ENCOUNTER — Encounter (HOSPITAL_COMMUNITY): Payer: Self-pay | Admitting: General Surgery

## 2015-09-06 DIAGNOSIS — K8010 Calculus of gallbladder with chronic cholecystitis without obstruction: Secondary | ICD-10-CM | POA: Diagnosis not present

## 2015-09-06 NOTE — Discharge Summary (Signed)
Physician Discharge Summary  Patient ID: Sarah Lyons MRN: [id number] DOB/AGE: 02-Apr-1986 28 y.o.  Admit date: 09/05/2015 Discharge date: 09/06/2015  Admission Diagnoses:  Discharge Diagnoses:  Active Problems:   Gallstones   Discharged Condition: good  Hospital Course: the pt underwent lap chole. She tolerated the surgery well but postop had some nausea. She stayed overnight and feels better this am. If she tolerates breakfast then she will be discharged home  Consults: None  Significant Diagnostic Studies: none  Treatments: surgery: as above  Discharge Exam: Blood pressure 81/43, pulse 60, temperature 98.4 F (36.9 C), temperature source Oral, resp. rate 17, height 5\' 3"  (1.6 m), weight 75.3 kg (166 lb 0.1 oz), last menstrual period 08/13/2015, SpO2 99 %. Resp: clear to auscultation bilaterally Cardio: regular rate and rhythm GI: soft, nontender  Disposition:   Discharge Instructions    Call MD for:  difficulty breathing, headache or visual disturbances    Complete by:  As directed      Call MD for:  difficulty breathing, headache or visual disturbances    Complete by:  As directed      Call MD for:  extreme fatigue    Complete by:  As directed      Call MD for:  extreme fatigue    Complete by:  As directed      Call MD for:  hives    Complete by:  As directed      Call MD for:  hives    Complete by:  As directed      Call MD for:  persistant dizziness or light-headedness    Complete by:  As directed      Call MD for:  persistant dizziness or light-headedness    Complete by:  As directed      Call MD for:  persistant nausea and vomiting    Complete by:  As directed      Call MD for:  persistant nausea and vomiting    Complete by:  As directed      Call MD for:  redness, tenderness, or signs of infection (pain, swelling, redness, odor or green/yellow discharge around incision site)    Complete by:  As directed      Call MD for:  redness, tenderness, or signs  of infection (pain, swelling, redness, odor or green/yellow discharge around incision site)    Complete by:  As directed      Call MD for:  severe uncontrolled pain    Complete by:  As directed      Call MD for:  severe uncontrolled pain    Complete by:  As directed      Call MD for:  temperature >100.4    Complete by:  As directed      Call MD for:  temperature >100.4    Complete by:  As directed      Diet - low sodium heart healthy    Complete by:  As directed      Diet - low sodium heart healthy    Complete by:  As directed      Discharge instructions    Complete by:  As directed   May shower. No heavy lifting. Low fat diet     Discharge instructions    Complete by:  As directed   May shower. Low fat diet. No heavy lifting     Increase activity slowly    Complete by:  As directed      Increase  activity slowly    Complete by:  As directed      No wound care    Complete by:  As directed      No wound care    Complete by:  As directed             Medication List    TAKE these medications        ibuprofen 200 MG tablet  Commonly known as:  ADVIL,MOTRIN  Take 400 mg by mouth every 6 (six) hours as needed.     oxyCODONE-acetaminophen 5-325 MG tablet  Commonly known as:  ROXICET  Take 1-2 tablets by mouth every 4 (four) hours as needed.           Follow-up Information    Follow up with TOTH III,Hasani Diemer S, MD In 3 weeks.   Specialty:  General Surgery   Contact information:   [address] [place] [address] [phone]       Signed: Merrie Roof 09/06/2015, 8:29 AM

## 2015-09-06 NOTE — Progress Notes (Signed)
1 Day Post-Op  Subjective: Nausea seems to be better today. She has not had breakfast yet  Objective: Vital signs in last 24 hours: Temp:  [97.3 F (36.3 C)-98.4 F (36.9 C)] 98.4 F (36.9 C) (11/03 0524) Pulse Rate:  [52-79] 60 (11/03 0524) Resp:  [10-19] 17 (11/02 2139) BP: (81-112)/(43-71) 81/43 mmHg (11/03 0524) SpO2:  [96 %-100 %] 99 % (11/03 0524) Weight:  [75.3 kg (166 lb 0.1 oz)] 75.3 kg (166 lb 0.1 oz) (11/02 1158) Last BM Date: 09/04/15  Intake/Output from previous day: 11/02 0701 - 11/03 0700 In: 2333.3 [P.O.:840; I.V.:1493.3] Out: 25 [Blood:25] Intake/Output this shift:    Resp: clear to auscultation bilaterally Cardio: regular rate and rhythm GI: soft, nontender. incisions look good  Lab Results:  No results for input(s): WBC, HGB, HCT, PLT in the last 72 hours. BMET No results for input(s): NA, K, CL, CO2, GLUCOSE, BUN, CREATININE, CALCIUM in the last 72 hours. PT/INR No results for input(s): LABPROT, INR in the last 72 hours. ABG No results for input(s): PHART, HCO3 in the last 72 hours.  Invalid input(s): PCO2, PO2  Studies/Results: Dg Cholangiogram Operative  09/05/2015  CLINICAL DATA:  Cholecystectomy for symptomatic cholelithiasis. EXAM: INTRAOPERATIVE CHOLANGIOGRAM TECHNIQUE: Cholangiographic images from the C-arm fluoroscopic device were submitted for interpretation post-operatively. Please see the procedural report for the amount of contrast and the fluoroscopy time utilized. COMPARISON:  Right upper quadrant ultrasound on 06/14/2015 FINDINGS: Intraoperative cholangiogram obtained with a C-arm demonstrates a normal opacified biliary tree without evidence of filling defect or obstruction. Contrast enters the duodenum normally. No contrast extravasation is identified. IMPRESSION: Normal intraoperative cholangiogram. Electronically Signed   By: Aletta Edouard M.D.   On: 09/05/2015 09:37    Anti-infectives: Anti-infectives    Start     Dose/Rate Route  Frequency Ordered Stop   09/05/15 0800  ceFAZolin (ANCEF) IVPB 2 g/50 mL premix     2 g 100 mL/hr over 30 Minutes Intravenous To [facility] 09/04/15 1353 09/05/15 0855      Assessment/Plan: s/p Procedure(s): LAPAROSCOPIC CHOLECYSTECTOMY WITH INTRAOPERATIVE CHOLANGIOGRAM (N/A) Advance diet Discharge     TOTH III,PAUL S 09/06/2015

## 2016-02-28 ENCOUNTER — Encounter: Payer: Self-pay | Admitting: Physician Assistant

## 2016-02-28 ENCOUNTER — Ambulatory Visit: Payer: Self-pay | Admitting: Physician Assistant

## 2016-02-28 VITALS — BP 100/70 | HR 76 | Temp 98.7°F

## 2016-02-28 DIAGNOSIS — J018 Other acute sinusitis: Secondary | ICD-10-CM

## 2016-02-28 MED ORDER — FLUTICASONE PROPIONATE 50 MCG/ACT NA SUSP: 2.0000 | Freq: Every day | NASAL | Status: DC

## 2016-02-28 MED ORDER — AZITHROMYCIN 250 MG PO TABS: ORAL_TABLET | ORAL | Status: DC

## 2016-02-28 NOTE — Progress Notes (Signed)
S: C/o runny nose and congestion for 10 days, no fever, chills, cp/sob, v/d; mucus is green and thick, cough is sporadic, c/o of facial and dental pain.   Using otc meds:   O: PE: vitals wnl, nad, perrl eomi, normocephalic, tms dull, nasal mucosa red and swollen, throat injected, neck supple no lymph, lungs c t a, cv rrr, neuro intact  A:  Acute sinusitis   P: zpack, flonase; drink fluids, continue regular meds , use otc meds of choice, return if not improving in 5 days, return earlier if worsening

## 2016-03-03 DIAGNOSIS — D2261 Melanocytic nevi of right upper limb, including shoulder: Secondary | ICD-10-CM | POA: Diagnosis not present

## 2016-03-03 DIAGNOSIS — D485 Neoplasm of uncertain behavior of skin: Secondary | ICD-10-CM | POA: Diagnosis not present

## 2016-03-03 DIAGNOSIS — L821 Other seborrheic keratosis: Secondary | ICD-10-CM | POA: Diagnosis not present

## 2016-03-03 DIAGNOSIS — L814 Other melanin hyperpigmentation: Secondary | ICD-10-CM | POA: Diagnosis not present

## 2016-03-03 DIAGNOSIS — I788 Other diseases of capillaries: Secondary | ICD-10-CM | POA: Diagnosis not present

## 2016-03-03 DIAGNOSIS — Z1283 Encounter for screening for malignant neoplasm of skin: Secondary | ICD-10-CM | POA: Diagnosis not present

## 2016-11-07 DIAGNOSIS — J018 Other acute sinusitis: Secondary | ICD-10-CM | POA: Diagnosis not present

## 2016-11-18 DIAGNOSIS — Z6827 Body mass index (BMI) 27.0-27.9, adult: Secondary | ICD-10-CM | POA: Diagnosis not present

## 2016-11-18 DIAGNOSIS — Z113 Encounter for screening for infections with a predominantly sexual mode of transmission: Secondary | ICD-10-CM | POA: Diagnosis not present

## 2016-11-18 DIAGNOSIS — Z01419 Encounter for gynecological examination (general) (routine) without abnormal findings: Secondary | ICD-10-CM | POA: Diagnosis not present

## 2016-12-01 ENCOUNTER — Telehealth: Payer: Self-pay | Admitting: General Practice

## 2016-12-01 NOTE — Telephone Encounter (Signed)
-----   Message from Ian Malkin sent at 11/26/2016 10:58 AM EST -----   ----- Message ----- From: Rosebud Poles Sent: 11/25/2016  10:11 AM To: Ian Malkin  Pt would like to set up NP appt at [facility] for her and her husband. Please call back on cell number listed.  Thanks,

## 2016-12-07 ENCOUNTER — Encounter: Payer: Self-pay | Admitting: Family Medicine

## 2016-12-08 ENCOUNTER — Encounter: Payer: Self-pay | Admitting: Family Medicine

## 2016-12-08 ENCOUNTER — Ambulatory Visit (INDEPENDENT_AMBULATORY_CARE_PROVIDER_SITE_OTHER): Payer: BLUE CROSS/BLUE SHIELD | Admitting: Family Medicine

## 2016-12-08 VITALS — BP 110/68 | HR 53 | Temp 98.0°F | Ht 63.0 in | Wt 153.2 lb

## 2016-12-08 DIAGNOSIS — Z6827 Body mass index (BMI) 27.0-27.9, adult: Secondary | ICD-10-CM | POA: Insufficient documentation

## 2016-12-08 DIAGNOSIS — R1011 Right upper quadrant pain: Secondary | ICD-10-CM | POA: Diagnosis not present

## 2016-12-08 DIAGNOSIS — G8929 Other chronic pain: Secondary | ICD-10-CM | POA: Diagnosis not present

## 2016-12-08 NOTE — Progress Notes (Signed)
Pre visit review using our clinic review tool, if applicable. No additional management support is needed unless otherwise documented below in the visit note. 

## 2016-12-08 NOTE — Progress Notes (Signed)
Patient ID: Sarah Lyons, female  DOB: [birth year]  Age: 31 y.o.  MRN: [id number]  History of Present Illness:   Sarah Lyons is a 31 y.o. female who presents for evaluation of abdominal pain. The pain is described as colicky, and is [id number] in intensity. Pain is located in the RUQ without radiation. Onset was 1 year ago. Symptoms have been unchanged since. Aggravating factors: pressure.  Associated symptoms: none. The patient denies belching, chills, constipation, diarrhea, fever, flatus, nausea and vomiting.   PMHx, SurgHx, SocialHx, Medications, and Allergies were reviewed in the Visit Navigator and updated as appropriate.   Medical History: Surgical History:  Past Medical History:  Diagnosis Date  . Anemia    hx   Past Surgical History:  Procedure Laterality Date  . CHOLECYSTECTOMY N/A 09/05/2015   Procedure: LAPAROSCOPIC CHOLECYSTECTOMY WITH INTRAOPERATIVE CHOLANGIOGRAM;  Surgeon: Autumn Messing III, MD;  Location: [facility];  Service: General;  Laterality: N/A;  . MOUTH SURGERY     teeth       Family History: Social History:  No family history on file. Social History  Substance Use Topics  . Smoking status: Never Smoker  . Smokeless tobacco: Never Used  . Alcohol use 4.2 oz/week    4 Glasses of wine, 3 Cans of beer per week      Allergies:   Allergies  Allergen Reactions  . Sulfonamide Derivatives     REACTION: RASH AS CHILD     Current Medications:   Prior to Admission medications   Not on File     Review of Systems:   Constitutional: Negative for fever, chills, or weight loss. HEENT: Negative for ear discharge, ear pain, mouth sores, sore throat, tinnitus and trouble swallowing.  No eye pain. LUNGS: Negative for cough, choking, chest tightness, shortness of breath and wheezing.   CV: Negative for chest pain, palpitations and leg swelling.  GU: Negative for dysuria, flank pain and difficulty urinating.  MSK:: Negative for unusual  joint swelling  or pains, gait problem, neck pain and neck stiffness.   Vitals:   Vitals:   12/08/16 1405  BP: 110/68  Pulse: (!) 53  Temp: 98 F (36.7 C)  TempSrc: Oral  SpO2: 99%  Weight: 153 lb 3.2 oz (69.5 kg)  Height: 5\' 3"  (1.6 m)     Body mass index is 27.14 kg/m.   Physical Exam:   General appearance: Alert, cooperative, appears stated age and no distress. Head: Normocephalic, without obvious abnormality, atraumatic. Eyes: Conjunctivae/corneas clear. PERRL, EOM's intact. Fundi benign. Ears: Normal TM's and external ear canals both ears. Nose: Nares normal. Septum midline. Mucosa normal. No drainage or sinus tenderness. Throat: Lips, mucosa, and tongue normal; teeth and gums normal. Lungs: Clear to auscultation bilaterally. Heart: Regular rate and rhythm, S1, S2 normal, no murmur, click, rub or gallop. Abdomen: ttp RUQ with mass, or rebound Extremities: Extremities normal, atraumatic, no cyanosis or edema. Pulses: 2+ and symmetric. Skin: Skin color, texture, turgor normal. No rashes or lesions. Neurologic: Alert and oriented X 3, normal strength and tone. Normal symmetric. reflexes. Normal coordination and gait.   Assessment and Plan:    [name] was seen today for establish care.  Diagnoses and all orders for this visit:  Chronic RUQ pain -   Hx of gallbladder removal, with RUQ pain since the surgery. Ongoing for one year now, with pain worse with pressure (ex. lying on her stomach). Recent fasting labs were WNL.  -   US Abdomen Limited RUQ;  Future  BMI 27.0-27.9,adult   . Reviewed expectations re: course of current medical issues. . Discussed self-management of symptoms. . Outlined signs and symptoms indicating need for more acute intervention. . Patient verbalized understanding and all questions were answered. . See orders for this visit as documented in the electronic medical record. . Patient received an After Visit Summary.   Briscoe Deutscher, D.O.

## 2016-12-11 ENCOUNTER — Ambulatory Visit (HOSPITAL_COMMUNITY): Payer: BLUE CROSS/BLUE SHIELD

## 2016-12-18 ENCOUNTER — Ambulatory Visit (HOSPITAL_COMMUNITY)
Admission: RE | Admit: 2016-12-18 | Discharge: 2016-12-18 | Disposition: A | Discharge status: Home / Self Care | Payer: BLUE CROSS/BLUE SHIELD | Source: Ambulatory Visit | Attending: Family Medicine | Admitting: Family Medicine

## 2016-12-18 DIAGNOSIS — G8929 Other chronic pain: Secondary | ICD-10-CM | POA: Diagnosis not present

## 2016-12-18 DIAGNOSIS — Z9049 Acquired absence of other specified parts of digestive tract: Secondary | ICD-10-CM | POA: Diagnosis not present

## 2016-12-18 DIAGNOSIS — R1011 Right upper quadrant pain: Secondary | ICD-10-CM | POA: Diagnosis not present

## 2017-03-03 DIAGNOSIS — D1800 Hemangioma unspecified site: Secondary | ICD-10-CM | POA: Diagnosis not present

## 2017-03-03 DIAGNOSIS — L820 Inflamed seborrheic keratosis: Secondary | ICD-10-CM | POA: Diagnosis not present

## 2017-03-03 DIAGNOSIS — D229 Melanocytic nevi, unspecified: Secondary | ICD-10-CM | POA: Diagnosis not present

## 2017-03-03 DIAGNOSIS — Z1283 Encounter for screening for malignant neoplasm of skin: Secondary | ICD-10-CM | POA: Diagnosis not present

## 2017-04-10 ENCOUNTER — Encounter: Payer: Self-pay | Admitting: Physician Assistant

## 2017-04-10 ENCOUNTER — Ambulatory Visit (INDEPENDENT_AMBULATORY_CARE_PROVIDER_SITE_OTHER): Payer: BLUE CROSS/BLUE SHIELD

## 2017-04-10 ENCOUNTER — Telehealth: Payer: Self-pay | Admitting: Physician Assistant

## 2017-04-10 ENCOUNTER — Ambulatory Visit (INDEPENDENT_AMBULATORY_CARE_PROVIDER_SITE_OTHER): Payer: BLUE CROSS/BLUE SHIELD | Admitting: Physician Assistant

## 2017-04-10 VITALS — BP 120/80 | HR 63 | Temp 98.4°F | Ht 63.0 in | Wt 155.0 lb

## 2017-04-10 DIAGNOSIS — M79672 Pain in left foot: Secondary | ICD-10-CM

## 2017-04-10 NOTE — Progress Notes (Signed)
Sarah Lyons is a 31 y.o. female here for left great toe pain.  I acted as a Education administrator for [employer], LPN  History of Present Illness:   Chief Complaint  Patient presents with  . pain right great toe    Toe Pain   Incident onset: Left great toe x several months, worse past 6 weeks. Incident location: notified moths ago after walking around in high heels all day. There was no injury mechanism. Pain location: Right great toe. The quality of the pain is described as aching. The pain is at a severity of 3/10. The pain is mild. The pain has been fluctuating since onset. Pertinent negatives include no inability to bear weight, loss of motion, loss of sensation, muscle weakness, numbness or tingling. Associated symptoms comments: Noticed bruise on outside great toe yesterday.. She reports no foreign bodies present. Exacerbated by: certain shoes and walking. Treatments tried: wearing flat shoes and tennis shoes. The treatment provided moderate relief.   Was on feet all day yesterday for [employer] work, was in tennis shoes. States that the shoes are not new, and normal. Did not have any specific incident dropping anything on her toe, or tripping. Noticed redness and bruising since last night to the medial side of her L great toe.  PMHx, SurgHx, SocialHx, Medications, and Allergies were reviewed in the Visit Navigator and updated as appropriate.  Current Medications:   Current Outpatient Prescriptions:  .  ibuprofen (ADVIL,MOTRIN) 200 MG tablet, Take 400 mg by mouth as needed., Disp: , Rfl:  .  loratadine (CLARITIN) 10 MG tablet, Take 10 mg by mouth daily., Disp: , Rfl:    Review of Systems:   Review of Systems  Neurological: Negative for tingling and numbness.    Vitals:   Vitals:   04/10/17 1306  BP: 120/80  Pulse: 63  Temp: 98.4 F (36.9 C)  TempSrc: Oral  SpO2: 99%  Weight: 155 lb (70.3 kg)  Height: 5\' 3"  (1.6 m)     Body mass index is  27.46 kg/m.  Physical Exam:   Physical Exam  Constitutional: She appears well-developed. She is cooperative.  Non-toxic appearance. She does not have a sickly appearance. She does not appear ill. No distress.  Cardiovascular: Normal rate, regular rhythm, S1 normal, S2 normal, normal heart sounds and normal pulses.  Exam reveals no decreased pulses.   No LE edema, capillary refill normal in bilateral toes  Pulmonary/Chest: Effort normal and breath sounds normal.  Musculoskeletal:  Ecchymosis to medial first metatarsal of left great toe, no point tenderness, no decrease in range of motion, no swelling or erythema. No other deformities visualized or palpated along other metatarsals of left foot.  Neurological: She is alert.  Nursing note and vitals reviewed.  EXAM: LEFT FOOT - 2 VIEW  COMPARISON:  None.  FINDINGS: There is no evidence of fracture or dislocation. There is no evidence of arthropathy or other focal bone abnormality. Soft tissues are unremarkable.  IMPRESSION: Normal left foot.  Assessment and Plan:    Sarah Lyons was seen today for pain right great toe.  Diagnoses and all orders for this visit:  Left foot pain -     DG Foot 2 Views Left; Future   X-ray of left foot is unremarkable. Discussed case with Dr. Teresa Coombs. We'll put patient in postop shoe and have her follow up with Dr. Teresa Coombs within 1 week. I advised her to alternate Tylenol and ibuprofen as needed for pain.  I advised her to stop wearing heels. She denied any further questions or concerns.   . Reviewed expectations re: course of current medical issues. . Discussed self-management of symptoms. . Outlined signs and symptoms indicating need for more acute intervention. . Patient verbalized understanding and all questions were answered. . See orders for this visit as documented in the electronic medical record. . Patient received an After-Visit Summary.  [name] or LPN served as scribe during  this visit. History, Physical, and Plan performed by medical provider. Documentation and orders reviewed and attested to.  [name], PA-C

## 2017-04-10 NOTE — Patient Instructions (Addendum)
It was great meeting you. We will call you regarding your xray results.  Alternate tylenol or ibuprofen for your pain.  Wear the post-op shoe to keep your foot from bending.  Please make a follow-up appointment with Dr. Teresa Coombs after 1 week.   Foot Pain Many things can cause foot pain. Some common causes are:  An injury.  A sprain.  Arthritis.  Blisters.  Bunions.  Follow these instructions at home: Pay attention to any changes in your symptoms. Take these actions to help with your discomfort:  If directed, put ice on the affected area: ? Put ice in a plastic bag. ? Place a towel between your skin and the bag. ? Leave the ice on for 15-20 minutes, 3?4 times a day for 2 days.  Take over-the-counter and prescription medicines only as told by your health care provider.  Wear comfortable, supportive shoes that fit you well. Do not wear high heels.  Do not stand or walk for long periods of time.  Do not lift a lot of weight. This can put added pressure on your feet.  Do stretches to relieve foot pain and stiffness as told by your health care provider.  Rub your foot gently.  Keep your feet clean and dry.  Contact a health care provider if:  Your pain does not get better after a few days of self-care.  Your pain gets worse.  You cannot stand on your foot. Get help right away if:  Your foot is numb or tingling.  Your foot or toes are swollen.  Your foot or toes turn white or blue.  You have warmth and redness along your foot. This information is not intended to replace advice given to you by your health care provider. Make sure you discuss any questions you have with your health care provider. Document Released: 11/16/2015 Document Revised: 03/27/2016 Document Reviewed: 11/15/2014 Elsevier Interactive Patient Education  [employer].

## 2017-04-10 NOTE — Telephone Encounter (Signed)
Patient is scheduled at 1pm today with Sarah Lyons patient) for pain in toe/foot for a few months; bruise on inside of big toe.

## 2017-04-10 NOTE — Telephone Encounter (Signed)
Noted  

## 2017-04-15 DIAGNOSIS — M7752 Other enthesopathy of left foot: Secondary | ICD-10-CM | POA: Diagnosis not present

## 2017-04-15 DIAGNOSIS — M7751 Other enthesopathy of right foot: Secondary | ICD-10-CM | POA: Diagnosis not present

## 2017-04-15 DIAGNOSIS — M2580 Other specified joint disorders, unspecified joint: Secondary | ICD-10-CM | POA: Diagnosis not present

## 2017-04-15 DIAGNOSIS — M659 Synovitis and tenosynovitis, unspecified: Secondary | ICD-10-CM | POA: Diagnosis not present

## 2017-04-22 DIAGNOSIS — M7751 Other enthesopathy of right foot: Secondary | ICD-10-CM | POA: Diagnosis not present

## 2017-04-22 DIAGNOSIS — M7752 Other enthesopathy of left foot: Secondary | ICD-10-CM | POA: Diagnosis not present

## 2017-04-22 DIAGNOSIS — M659 Synovitis and tenosynovitis, unspecified: Secondary | ICD-10-CM | POA: Diagnosis not present

## 2017-08-05 DIAGNOSIS — H15002 Unspecified scleritis, left eye: Secondary | ICD-10-CM | POA: Diagnosis not present

## 2017-08-05 DIAGNOSIS — D23122 Other benign neoplasm of skin of left lower eyelid, including canthus: Secondary | ICD-10-CM | POA: Diagnosis not present

## 2017-08-11 DIAGNOSIS — D23122 Other benign neoplasm of skin of left lower eyelid, including canthus: Secondary | ICD-10-CM | POA: Diagnosis not present

## 2017-08-11 DIAGNOSIS — H15002 Unspecified scleritis, left eye: Secondary | ICD-10-CM | POA: Diagnosis not present

## 2017-08-28 DIAGNOSIS — H15002 Unspecified scleritis, left eye: Secondary | ICD-10-CM | POA: Diagnosis not present

## 2017-08-28 DIAGNOSIS — D23122 Other benign neoplasm of skin of left lower eyelid, including canthus: Secondary | ICD-10-CM | POA: Diagnosis not present

## 2017-11-25 DIAGNOSIS — Z01419 Encounter for gynecological examination (general) (routine) without abnormal findings: Secondary | ICD-10-CM | POA: Diagnosis not present

## 2017-11-25 DIAGNOSIS — Z6826 Body mass index (BMI) 26.0-26.9, adult: Secondary | ICD-10-CM | POA: Diagnosis not present

## 2017-11-30 LAB — HM PAP SMEAR: HM Pap smear: NEGATIVE

## 2017-12-13 DIAGNOSIS — J020 Streptococcal pharyngitis: Secondary | ICD-10-CM | POA: Diagnosis not present

## 2017-12-16 ENCOUNTER — Encounter: Payer: Self-pay | Admitting: Family Medicine

## 2017-12-16 ENCOUNTER — Ambulatory Visit (INDEPENDENT_AMBULATORY_CARE_PROVIDER_SITE_OTHER): Payer: BLUE CROSS/BLUE SHIELD | Admitting: Family Medicine

## 2017-12-16 VITALS — BP 118/82 | HR 73 | Temp 98.6°F | Wt 152.4 lb

## 2017-12-16 DIAGNOSIS — J020 Streptococcal pharyngitis: Secondary | ICD-10-CM | POA: Diagnosis not present

## 2017-12-16 MED ORDER — PREDNISONE 5 MG PO TABS: 5.0000 mg | ORAL_TABLET | Freq: Every day | ORAL | 0 refills | Status: DC

## 2017-12-16 NOTE — Progress Notes (Signed)
   Sarah Lyons is a 32 y.o. female here for an acute visit.  History of Present Illness:   [name], [name] acting as scribe for Dr. Briscoe Deutscher.   Sore Throat   This is a new problem. The current episode started in the past 7 days. The problem has been gradually improving. The pain is worse on the left side. There has been no fever. She has tried NSAIDs for the symptoms. The treatment provided mild relief.   PMHx, SurgHx, SocialHx, Medications, and Allergies were reviewed in the Visit Navigator and updated as appropriate.  Current Medications:   .  ibuprofen (ADVIL,MOTRIN) 200 MG tablet, Take 400 mg by mouth as needed., Disp: , Rfl:  .  penicillin v potassium (VEETID) 500 MG tablet, Take by mouth., Disp: , Rfl:    Allergies  Allergen Reactions  . Sulfonamide Derivatives     REACTION: RASH AS CHILD   Review of Systems:   Pertinent items are noted in the HPI. Otherwise, ROS is negative.  Vitals:   Vitals:   12/16/17 1306  BP: 118/82  Pulse: 73  Temp: 98.6 F (37 C)  TempSrc: Oral  SpO2: 99%  Weight: 152 lb 6.4 oz (69.1 kg)     Body mass index is 27 kg/m.  Physical Exam:   Physical Exam  Constitutional: She is oriented to person, place, and time. She appears well-developed and well-nourished. She appears ill.  HENT:  Head: Normocephalic and atraumatic.  Right Ear: Tympanic membrane normal.  Left Ear: Tympanic membrane normal.  Mouth/Throat: Posterior oropharyngeal edema and posterior oropharyngeal erythema present. Tonsils are 1+ on the right. Tonsils are 1+ on the left.  Eyes: EOM are normal. Pupils are equal, round, and reactive to light.  Neck: Normal range of motion. Neck supple.  Cardiovascular: Normal rate, regular rhythm, normal heart sounds and intact distal pulses.  Pulmonary/Chest: Effort normal.  Abdominal: Soft.  Neurological: She is alert and oriented to person, place, and time.  Skin: Skin is warm. Capillary refill takes less than 2  seconds.  Psychiatric: She has a normal mood and affect. Her behavior is normal.  Nursing note and vitals reviewed.   Assessment and Plan:   1. Strep throat - penicillin v potassium (VEETID) 500 MG tablet; Take by mouth. - predniSONE (DELTASONE) 5 MG tablet; Take 1 tablet (5 mg total) by mouth daily with breakfast. Day 1 6 tab then 5 -4-3-2-1  Dispense: 21 tablet; Refill: 0   . Reviewed expectations re: course of current medical issues. . Discussed self-management of symptoms. . Outlined signs and symptoms indicating need for more acute intervention. . Patient verbalized understanding and all questions were answered. Marland Kitchen Health Maintenance issues including appropriate healthy diet, exercise, and smoking avoidance were discussed with patient. . See orders for this visit as documented in the electronic medical record. . Patient received an After Visit Summary.  [name] served as Education administrator during this visit. History, Physical, and Plan performed by medical provider. The above documentation has been reviewed and is accurate and complete. Briscoe Deutscher, D.O.  Briscoe Deutscher, DO [place], [place] [facility] 12/16/2017

## 2018-02-11 DIAGNOSIS — M659 Synovitis and tenosynovitis, unspecified: Secondary | ICD-10-CM | POA: Diagnosis not present

## 2018-02-11 DIAGNOSIS — M2580 Other specified joint disorders, unspecified joint: Secondary | ICD-10-CM | POA: Diagnosis not present

## 2018-02-11 DIAGNOSIS — M7752 Other enthesopathy of left foot: Secondary | ICD-10-CM | POA: Diagnosis not present

## 2018-04-27 DIAGNOSIS — L820 Inflamed seborrheic keratosis: Secondary | ICD-10-CM | POA: Diagnosis not present

## 2018-04-27 DIAGNOSIS — L700 Acne vulgaris: Secondary | ICD-10-CM | POA: Diagnosis not present

## 2018-04-27 DIAGNOSIS — D1800 Hemangioma unspecified site: Secondary | ICD-10-CM | POA: Diagnosis not present

## 2018-04-27 DIAGNOSIS — Z1283 Encounter for screening for malignant neoplasm of skin: Secondary | ICD-10-CM | POA: Diagnosis not present

## 2018-04-27 DIAGNOSIS — D229 Melanocytic nevi, unspecified: Secondary | ICD-10-CM | POA: Diagnosis not present

## 2018-05-09 ENCOUNTER — Other Ambulatory Visit: Payer: Self-pay | Admitting: Family Medicine

## 2018-05-16 NOTE — Progress Notes (Signed)
Subjective:    Sarah Lyons is a 32 y.o. female and is here for a comprehensive physical exam.  Suffering from some anxiety. Husband with mood changes affecting the family. She is now saying at home with her children and having a hard managing her temper - yelling at the kids. Crying at night due to guilt. No issues with harmful behavior toward children. No SI/HI. No ETOH, drugs.   Current Outpatient Medications:  .  None  Health Maintenance Due  Topic Date Due  . HIV Screening  04/16/1999  . PAP SMEAR  06/03/2010   PMHx, SurgHx, SocialHx, Medications, and Allergies were reviewed in the Visit Navigator and updated as appropriate.   Past Medical History:  Diagnosis Date  . Anemia    hx     Past Surgical History:  Procedure Laterality Date  . CHOLECYSTECTOMY N/A 09/05/2015   Procedure: LAPAROSCOPIC CHOLECYSTECTOMY WITH INTRAOPERATIVE CHOLANGIOGRAM;  Surgeon: Autumn Messing III, MD;  Location: [place];  Service: General;  Laterality: N/A;  . MOUTH SURGERY     teeth     History reviewed. No pertinent family history.  Social History   Tobacco Use  . Smoking status: Never Smoker  . Smokeless tobacco: Never Used  Substance Use Topics  . Alcohol use: Yes    Alcohol/week: 4.2 oz    Types: 4 Glasses of wine, 3 Cans of beer per week  . Drug use: No    Review of Systems:   Pertinent items are noted in the HPI. Otherwise, ROS is negative.  Objective:   BP 110/64   Pulse 81   Temp (!) 97.5 F (36.4 C) (Oral)   Ht 5\' 3"  (1.6 m)   Wt 149 lb 6.4 oz (67.8 kg)   LMP 05/03/2018   SpO2 98%   BMI 26.47 kg/m    General appearance: alert, cooperative and appears stated age. Head: normocephalic, without obvious abnormality, atraumatic. Neck: no adenopathy, supple, symmetrical, trachea midline; thyroid not enlarged, symmetric, no tenderness/mass/nodules. Lungs: clear to auscultation bilaterally. Heart: regular rate and rhythm Abdomen: soft, non-tender; no masses,  no  organomegaly. Extremities: extremities normal, atraumatic, no cyanosis or edema. Skin: skin color, texture, turgor normal, no rashes or lesions. Lymph: cervical, supraclavicular, and axillary nodes normal; no abnormal inguinal nodes palpated. Neurologic: grossly normal.  Assessment/Plan:   Sarah Lyons was seen today for annual exam.  Diagnoses and all orders for this visit:  Routine physical examination  Migraine without aura and without status migrainosus, not intractable  Iron deficiency anemia due to chronic blood loss -     CBC with Differential/Platelet -     Comprehensive metabolic panel  Screening for lipid disorders -     Lipid panel  Screening for HIV (human immunodeficiency virus) -     HIV antibody  Situational anxiety -     sertraline (ZOLOFT) 25 MG tablet; Take 1 tablet (25 mg total) by mouth at bedtime.    Patient Counseling:   [x]     Nutrition: Stressed importance of moderation in sodium/caffeine intake, saturated fat and cholesterol, caloric balance, sufficient intake of fresh fruits, vegetables, fiber, calcium, iron, and 1 mg of folate supplement per day (for females capable of pregnancy).   [x]      Stressed the importance of regular exercise.    [x]     Substance Abuse: Discussed cessation/primary prevention of tobacco, alcohol, or other drug use; driving or other dangerous activities under the influence; availability of treatment for abuse.    [x]   Injury prevention: Discussed safety belts, safety helmets, smoke detector, smoking near bedding or upholstery.    [x]      Sexuality: Discussed sexually transmitted diseases, partner selection, use of condoms, avoidance of unintended pregnancy  and contraceptive alternatives.    [x]     Dental health: Discussed importance of regular tooth brushing, flossing, and dental visits.   [x]      Health maintenance and immunizations reviewed. Please refer to Health maintenance section.   Briscoe Deutscher,  DO [place]

## 2018-05-17 ENCOUNTER — Encounter: Payer: Self-pay | Admitting: Family Medicine

## 2018-05-17 ENCOUNTER — Ambulatory Visit (INDEPENDENT_AMBULATORY_CARE_PROVIDER_SITE_OTHER): Payer: BLUE CROSS/BLUE SHIELD | Admitting: Family Medicine

## 2018-05-17 VITALS — BP 110/64 | HR 81 | Temp 97.5°F | Ht 63.0 in | Wt 149.4 lb

## 2018-05-17 DIAGNOSIS — Z114 Encounter for screening for human immunodeficiency virus [HIV]: Secondary | ICD-10-CM

## 2018-05-17 DIAGNOSIS — Z0000 Encounter for general adult medical examination without abnormal findings: Secondary | ICD-10-CM

## 2018-05-17 DIAGNOSIS — Z0001 Encounter for general adult medical examination with abnormal findings: Secondary | ICD-10-CM | POA: Diagnosis not present

## 2018-05-17 DIAGNOSIS — G43009 Migraine without aura, not intractable, without status migrainosus: Secondary | ICD-10-CM | POA: Diagnosis not present

## 2018-05-17 DIAGNOSIS — F418 Other specified anxiety disorders: Secondary | ICD-10-CM

## 2018-05-17 DIAGNOSIS — D500 Iron deficiency anemia secondary to blood loss (chronic): Secondary | ICD-10-CM

## 2018-05-17 DIAGNOSIS — Z13220 Encounter for screening for lipoid disorders: Secondary | ICD-10-CM

## 2018-05-17 LAB — COMPREHENSIVE METABOLIC PANEL
ALT: 32 U/L (ref 0–35)
AST: 20 U/L (ref 0–37)
Albumin: 4.6 g/dL (ref 3.5–5.2)
Alkaline Phosphatase: 48 U/L (ref 39–117)
BUN: 11 mg/dL (ref 6–23)
CO2: 26 mEq/L (ref 19–32)
Calcium: 9.5 mg/dL (ref 8.4–10.5)
Chloride: 105 mEq/L (ref 96–112)
Creatinine, Ser: 0.87 mg/dL (ref 0.40–1.20)
GFR: 79.18 mL/min (ref >60.00)
Glucose, Bld: 96 mg/dL (ref 70–99)
Potassium: 4.3 mEq/L (ref 3.5–5.1)
Sodium: 139 mEq/L (ref 135–145)
Total Bilirubin: 0.5 mg/dL (ref 0.2–1.2)
Total Protein: 7.4 g/dL (ref 6.0–8.3)

## 2018-05-17 LAB — CBC WITH DIFFERENTIAL/PLATELET
Basophils Absolute: 0 10*3/uL (ref 0.0–0.1)
Basophils Relative: 0.6 % (ref 0.0–3.0)
Eosinophils Absolute: 0.1 10*3/uL (ref 0.0–0.7)
Eosinophils Relative: 1.6 % (ref 0.0–5.0)
HCT: 40.1 % (ref 36.0–46.0)
Hemoglobin: 13.4 g/dL (ref 12.0–15.0)
Lymphocytes Relative: 42.9 % (ref 12.0–46.0)
Lymphs Abs: 2.2 10*3/uL (ref 0.7–4.0)
MCHC: 33.4 g/dL (ref 30.0–36.0)
MCV: 87.2 fl (ref 78.0–100.0)
Monocytes Absolute: 0.5 10*3/uL (ref 0.1–1.0)
Monocytes Relative: 9.4 % (ref 3.0–12.0)
Neutro Abs: 2.3 10*3/uL (ref 1.4–7.7)
Neutrophils Relative %: 45.5 % (ref 43.0–77.0)
Platelets: 220 10*3/uL (ref 150.0–400.0)
RBC: 4.59 Mil/uL (ref 3.87–5.11)
RDW: 12.4 % (ref 11.5–15.5)
WBC: 5.1 10*3/uL (ref 4.0–10.5)

## 2018-05-17 LAB — LIPID PANEL
Cholesterol: 142 mg/dL (ref 0–200)
HDL: 58.2 mg/dL (ref >39.00)
LDL Cholesterol: 73 mg/dL (ref 0–99)
NonHDL: 83.72
Total CHOL/HDL Ratio: 2
Triglycerides: 54 mg/dL (ref 0.0–149.0)
VLDL: 10.8 mg/dL (ref 0.0–40.0)

## 2018-05-17 MED ORDER — SERTRALINE HCL 25 MG PO TABS
25.0000 mg | ORAL_TABLET | Freq: Every day | ORAL | 6 refills | Status: DC
Start: 2018-05-17 — End: 2018-07-23

## 2018-05-18 LAB — HIV ANTIBODY (ROUTINE TESTING W REFLEX): HIV 1&2 Ab, 4th Generation: NONREACTIVE

## 2018-06-02 DIAGNOSIS — F4321 Adjustment disorder with depressed mood: Secondary | ICD-10-CM | POA: Diagnosis not present

## 2018-06-15 DIAGNOSIS — F4321 Adjustment disorder with depressed mood: Secondary | ICD-10-CM | POA: Diagnosis not present

## 2018-06-16 ENCOUNTER — Ambulatory Visit: Payer: BLUE CROSS/BLUE SHIELD | Admitting: Psychology

## 2018-07-23 ENCOUNTER — Other Ambulatory Visit: Payer: Self-pay

## 2018-07-23 DIAGNOSIS — F418 Other specified anxiety disorders: Secondary | ICD-10-CM

## 2018-07-23 MED ORDER — SERTRALINE HCL 25 MG PO TABS: 25.0000 mg | ORAL_TABLET | Freq: Every day | ORAL | 3 refills | Status: DC

## 2018-08-19 DIAGNOSIS — F4321 Adjustment disorder with depressed mood: Secondary | ICD-10-CM | POA: Diagnosis not present

## 2018-08-31 DIAGNOSIS — F4321 Adjustment disorder with depressed mood: Secondary | ICD-10-CM | POA: Diagnosis not present

## 2018-09-02 DIAGNOSIS — F4321 Adjustment disorder with depressed mood: Secondary | ICD-10-CM | POA: Diagnosis not present

## 2018-10-04 DIAGNOSIS — F4321 Adjustment disorder with depressed mood: Secondary | ICD-10-CM | POA: Diagnosis not present

## 2018-11-17 ENCOUNTER — Ambulatory Visit (INDEPENDENT_AMBULATORY_CARE_PROVIDER_SITE_OTHER): Payer: BLUE CROSS/BLUE SHIELD | Admitting: Family Medicine

## 2018-11-17 ENCOUNTER — Other Ambulatory Visit (HOSPITAL_COMMUNITY)
Admission: RE | Admit: 2018-11-17 | Discharge: 2018-11-17 | Disposition: A | Discharge status: Home / Self Care | Payer: BLUE CROSS/BLUE SHIELD | Source: Ambulatory Visit | Attending: Family Medicine | Admitting: Family Medicine

## 2018-11-17 ENCOUNTER — Encounter: Payer: Self-pay | Admitting: Family Medicine

## 2018-11-17 VITALS — BP 100/70 | HR 90 | Temp 98.9°F | Ht 63.0 in | Wt 142.4 lb

## 2018-11-17 DIAGNOSIS — Z113 Encounter for screening for infections with a predominantly sexual mode of transmission: Secondary | ICD-10-CM | POA: Insufficient documentation

## 2018-11-17 DIAGNOSIS — Z975 Presence of (intrauterine) contraceptive device: Secondary | ICD-10-CM | POA: Diagnosis not present

## 2018-11-17 DIAGNOSIS — G43009 Migraine without aura, not intractable, without status migrainosus: Secondary | ICD-10-CM | POA: Diagnosis not present

## 2018-11-17 DIAGNOSIS — F4321 Adjustment disorder with depressed mood: Secondary | ICD-10-CM | POA: Diagnosis not present

## 2018-11-17 DIAGNOSIS — F418 Other specified anxiety disorders: Secondary | ICD-10-CM | POA: Diagnosis not present

## 2018-11-17 LAB — URINALYSIS, ROUTINE W REFLEX MICROSCOPIC
Bilirubin Urine: NEGATIVE
Ketones, ur: NEGATIVE
Leukocytes, UA: NEGATIVE
Nitrite: NEGATIVE
Specific Gravity, Urine: 1.01 (ref 1.000–1.030)
Total Protein, Urine: NEGATIVE
Urine Glucose: NEGATIVE
Urobilinogen, UA: 0.2 (ref 0.0–1.0)
pH: 7.5 (ref 5.0–8.0)

## 2018-11-17 MED ORDER — ALPRAZOLAM 0.5 MG PO TABS: 0.5000 mg | ORAL_TABLET | Freq: Two times a day (BID) | ORAL | 0 refills | Status: DC | PRN

## 2018-11-17 MED ORDER — SERTRALINE HCL 50 MG PO TABS
50.0000 mg | ORAL_TABLET | Freq: Every day | ORAL | 3 refills | Status: DC
Start: 2018-11-17 — End: 2019-03-23

## 2018-11-17 NOTE — Patient Instructions (Signed)

## 2018-11-17 NOTE — Progress Notes (Signed)
Sarah Lyons is a 33 y.o. female is here for follow up.  History of Present Illness:   HPI: Patient left husband last November (see previous note). Recently discovered that he was, and still is, having an affair. Patient is tearful. Trying to be strong. Has custody of children. No SI. Struggling to eat due to nausea associated with the stress. Not sleeping well.   GAD 7 : Generalized Anxiety Score 11/17/2018 05/17/2018  Nervous, Anxious, on Edge 2 1  Control/stop worrying 2 1  Worry too much - different things 1 2  Trouble relaxing 3 1  Restless 1 0  Easily annoyed or irritable 1 2  Afraid - awful might happen 2 2  Total GAD 7 Score 12 9    There are no preventive care reminders to display for this patient. Depression screen [facility] 2/9 11/17/2018 05/17/2018 12/16/2017  Decreased Interest 1 0 0  Down, Depressed, Hopeless 1 0 0  PHQ - 2 Score 2 0 0  Altered sleeping 2 0 -  Tired, decreased energy 2 2 -  Change in appetite 2 1 -  Feeling bad or failure about yourself  1 1 -  Trouble concentrating 3 0 -  Moving slowly or fidgety/restless 2 0 -  Suicidal thoughts 0 0 -  PHQ-9 Score 14 4 -  Difficult doing work/chores Somewhat difficult Not difficult at all -   PMHx, SurgHx, SocialHx, FamHx, Medications, and Allergies were reviewed in the Visit Navigator and updated as appropriate.   Patient Active Problem List   Diagnosis Date Noted  . Situational anxiety 11/17/2018  . Migraine without aura 08/30/2007  . Iron deficiency anemia 07/30/2007   Social History   Tobacco Use  . Smoking status: Never Smoker  . Smokeless tobacco: Never Used  Substance Use Topics  . Alcohol use: Yes    Alcohol/week: 7.0 standard drinks    Types: 4 Glasses of wine, 3 Cans of beer per week  . Drug use: No   Current Medications and Allergies:   .  levonorgestrel (MIRENA) 20 MCG/24HR IUD, 1 each by Intrauterine route once., Disp: , Rfl:  .  sertraline (ZOLOFT) 25 MG tablet, Take 1 tablet (50  mg total) by mouth at bedtime., Disp: 90 tablet, Rfl: 3   Allergies  Allergen Reactions  . Sulfonamide Derivatives     REACTION: RASH AS CHILD   Review of Systems   Pertinent items are noted in the HPI. Otherwise, a complete ROS is negative.  Vitals:   Vitals:   11/17/18 1009  BP: 100/70  Pulse: 90  Temp: 98.9 F (37.2 C)  TempSrc: Oral  SpO2: 99%  Weight: 142 lb 6.4 oz (64.6 kg)  Height: 5\' 3"  (1.6 m)     Body mass index is 25.23 kg/m.  Physical Exam:   Physical Exam Vitals signs and nursing note reviewed.  Constitutional:      General: She is not in acute distress.    Appearance: She is well-developed.  HENT:     Head: Normocephalic and atraumatic.     Right Ear: External ear normal.     Left Ear: External ear normal.     Nose: Nose normal.  Eyes:     Conjunctiva/sclera: Conjunctivae normal.     Pupils: Pupils are equal, round, and reactive to light.  Neck:     Musculoskeletal: Normal range of motion and neck supple.     Thyroid: No thyromegaly.  Cardiovascular:     Rate and Rhythm: Normal  rate and regular rhythm.     Heart sounds: Normal heart sounds.  Pulmonary:     Effort: Pulmonary effort is normal.     Breath sounds: Normal breath sounds.  Abdominal:     General: Bowel sounds are normal.     Palpations: Abdomen is soft.  Musculoskeletal: Normal range of motion.  Lymphadenopathy:     Cervical: No cervical adenopathy.  Skin:    General: Skin is warm and dry.     Capillary Refill: Capillary refill takes less than 2 seconds.  Neurological:     Mental Status: She is alert and oriented to person, place, and time.  Psychiatric:        Attention and Perception: Attention normal.        Mood and Affect: Affect is tearful.        Speech: Speech normal.        Behavior: Behavior normal.        Thought Content: Thought content normal.        Cognition and Memory: Cognition and memory normal.        Judgment: Judgment normal.    Assessment and Plan:     Floria was seen today for follow-up.  Diagnoses and all orders for this visit:  Situational anxiety Comments: Discussed low dose prn Xanax. She will be meeting with a therapist as well. Precuations reviewed.  Orders: -     ALPRAZolam (XANAX) 0.5 MG tablet; Take 1 tablet (0.5 mg total) by mouth 2 (two) times daily as needed for anxiety.  Situational depression -     sertraline (ZOLOFT) 50 MG tablet; Take 1 tablet (50 mg total) by mouth at bedtime.  Routine screening for STI (sexually transmitted infection) -     HIV Antibody (routine testing w rflx) -     RPR -     Urinalysis, Routine w reflex microscopic -     Urine cytology ancillary only([place])  IUD (intrauterine device) in place    . Orders and follow up as documented in [place], reviewed diet, exercise and weight control, cardiovascular risk and specific lipid/LDL goals reviewed, reviewed medications and side effects in detail.  . Reviewed expectations re: course of current medical issues. . Outlined signs and symptoms indicating need for more acute intervention. . Patient verbalized understanding and all questions were answered. . Patient received an After Visit Summary.  Briscoe Deutscher, DO [place], [facility] 11/17/2018

## 2018-11-18 LAB — RPR: RPR Ser Ql: NONREACTIVE

## 2018-11-18 LAB — HIV ANTIBODY (ROUTINE TESTING W REFLEX): HIV 1&2 Ab, 4th Generation: NONREACTIVE

## 2018-11-18 LAB — URINE CYTOLOGY ANCILLARY ONLY
Chlamydia: NEGATIVE
Neisseria Gonorrhea: NEGATIVE
Trichomonas: NEGATIVE

## 2018-11-19 DIAGNOSIS — F4321 Adjustment disorder with depressed mood: Secondary | ICD-10-CM | POA: Diagnosis not present

## 2018-11-22 LAB — URINE CYTOLOGY ANCILLARY ONLY
Bacterial vaginitis: NEGATIVE
Candida vaginitis: NEGATIVE

## 2018-11-29 DIAGNOSIS — F4321 Adjustment disorder with depressed mood: Secondary | ICD-10-CM | POA: Diagnosis not present

## 2019-01-03 DIAGNOSIS — F4321 Adjustment disorder with depressed mood: Secondary | ICD-10-CM | POA: Diagnosis not present

## 2019-01-05 DIAGNOSIS — Z01419 Encounter for gynecological examination (general) (routine) without abnormal findings: Secondary | ICD-10-CM | POA: Diagnosis not present

## 2019-01-05 DIAGNOSIS — Z6824 Body mass index (BMI) 24.0-24.9, adult: Secondary | ICD-10-CM | POA: Diagnosis not present

## 2019-01-10 ENCOUNTER — Encounter: Payer: Self-pay | Admitting: Family Medicine

## 2019-01-10 ENCOUNTER — Ambulatory Visit (INDEPENDENT_AMBULATORY_CARE_PROVIDER_SITE_OTHER): Payer: BLUE CROSS/BLUE SHIELD | Admitting: Family Medicine

## 2019-01-10 VITALS — BP 104/68 | HR 93 | Temp 98.6°F | Ht 63.0 in | Wt 139.0 lb

## 2019-01-10 DIAGNOSIS — T148XXA Other injury of unspecified body region, initial encounter: Secondary | ICD-10-CM

## 2019-01-10 NOTE — Progress Notes (Signed)
   Sarah Lyons is a 33 y.o. female here for an acute visit.  History of Present Illness:   Sarah Lyons, [name] acting as scribe for Dr. Briscoe Deutscher.   HPI: Patient states she was getting ready for church on Sunday and noticed color change in lower lip and some swelling. No swelling in mouth or trouble breathing.  She denies any SOB.   PMHx, SurgHx, SocialHx, Medications, and Allergies were reviewed in the Visit Navigator and updated as appropriate.  Current Medications   .  ALPRAZolam (XANAX) 0.5 MG tablet, Take 1 tablet (0.5 mg total) by mouth 2 (two) times daily as needed for anxiety., Disp: 60 tablet, Rfl: 0 .  levonorgestrel (MIRENA) 20 MCG/24HR IUD, 1 each by Intrauterine route once., Disp: , Rfl:  .  sertraline (ZOLOFT) 50 MG tablet, Take 1 tablet (50 mg total) by mouth at bedtime., Disp: 90 tablet, Rfl: 3   Allergies  Allergen Reactions  . Sulfonamide Derivatives     REACTION: RASH AS CHILD   Review of Systems   Pertinent items are noted in the HPI. Otherwise, ROS is negative.  Vitals   Vitals:   01/10/19 1306  BP: 104/68  Pulse: 93  Temp: 98.6 F (37 C)  TempSrc: Oral  SpO2: 98%  Weight: 139 lb (63 kg)  Height: 5\' 3"  (1.6 m)     Body mass index is 24.62 kg/m.  Physical Exam   Physical Exam Vitals signs and nursing note reviewed.  HENT:     Head: Normocephalic and atraumatic.     Mouth/Throat:     Comments: Small resolving bruise on bottom lip. Eyes:     Pupils: Pupils are equal, round, and reactive to light.  Neck:     Musculoskeletal: Normal range of motion and neck supple.  Cardiovascular:     Rate and Rhythm: Normal rate and regular rhythm.     Heart sounds: Normal heart sounds.  Pulmonary:     Effort: Pulmonary effort is normal.  Abdominal:     Palpations: Abdomen is soft.  Skin:    General: Skin is warm.  Psychiatric:        Behavior: Behavior normal.     Assessment and Plan   Sarah Lyons was seen today for  follow-up.  Diagnoses and all orders for this visit:  Bruise    . Reviewed expectations re: course of current medical issues. . Discussed self-management of symptoms. . Outlined signs and symptoms indicating need for more acute intervention. . Patient verbalized understanding and all questions were answered. Marland Kitchen Health Maintenance issues including appropriate healthy diet, exercise, and smoking avoidance were discussed with patient. . See orders for this visit as documented in the electronic medical record. . Patient received an After Visit Summary.  [name] served as Education administrator during this visit. History, Physical, and Plan performed by medical provider. The above documentation has been reviewed and is accurate and complete. Briscoe Deutscher, D.O.  Briscoe Deutscher, DO [place], [facility] 01/10/2019

## 2019-01-16 NOTE — Progress Notes (Signed)
Sarah Lyons is a 33 y.o. female is here for follow up.  Assessment and Plan:   Sarah Lyons was seen today for follow-up.  Diagnoses and all orders for this visit:  Situational anxiety Comments: Stable. Continue current treatment.   Situational depression Comments: Stable. Continue current treatment.    Subjective:   HPI   Anxiety: Current symptoms: symptoms have improved after starting Zoloft. . No current suicidal and homicidal ideation. Side effects from treatment: none. She has not had to take xanax in over two weeks. Has noticed big improvement with symptoms. She is sleeping on average 6hrs a night.   Health Maintenance:  There are no preventive care reminders to display for this patient. Depression screen [facility] 2/9 11/17/2018 05/17/2018 12/16/2017  Decreased Interest 1 0 0  Down, Depressed, Hopeless 1 0 0  PHQ - 2 Score 2 0 0  Altered sleeping 2 0 -  Tired, decreased energy 2 2 -  Change in appetite 2 1 -  Feeling bad or failure about yourself  1 1 -  Trouble concentrating 3 0 -  Moving slowly or fidgety/restless 2 0 -  Suicidal thoughts 0 0 -  PHQ-9 Score 14 4 -  Difficult doing work/chores Somewhat difficult Not difficult at all -   PMHx, SurgHx, SocialHx, FamHx, Medications, and Allergies were reviewed in the Visit Navigator and updated as appropriate.   Patient Active Problem List   Diagnosis Date Noted  . Situational depression 01/17/2019  . Situational anxiety 11/17/2018  . Migraine without aura 08/30/2007   Social History   Tobacco Use  . Smoking status: Never Smoker  . Smokeless tobacco: Never Used  Substance Use Topics  . Alcohol use: Yes    Alcohol/week: 7.0 standard drinks    Types: 4 Glasses of wine, 3 Cans of beer per week  . Drug use: No   Current Medications and Allergies:   Current Outpatient Medications:  .  ALPRAZolam (XANAX) 0.5 MG tablet, Take 1 tablet (0.5 mg total) by mouth 2 (two) times daily as needed for anxiety., Disp: 60  tablet, Rfl: 0 .  levonorgestrel (MIRENA) 20 MCG/24HR IUD, 1 each by Intrauterine route once., Disp: , Rfl:  .  sertraline (ZOLOFT) 50 MG tablet, Take 1 tablet (50 mg total) by mouth at bedtime., Disp: 90 tablet, Rfl: 3   Allergies  Allergen Reactions  . Sulfonamide Derivatives     REACTION: RASH AS CHILD   Review of Systems   Pertinent items are noted in the HPI. Otherwise, ROS is negative.  Vitals:   Vitals:   01/17/19 0945  BP: 120/62  Pulse: 74  Temp: 98.5 F (36.9 C)  TempSrc: Oral  SpO2: 99%  Weight: 135 lb (61.2 kg)  Height: 5\' 3"  (1.6 m)     Body mass index is 23.91 kg/m. Physical Exam:   General: Cooperative, alert and oriented, well developed, well nourished, in no acute distress. HEENT: Pupils equal round reactive light and extraocular movements intact. Conjunctivae and lids unremarkable. No pallor or cyanosis, dentition good. Neck: No thyromegaly.  Cardiovascular: Regular rhythm. No murmurs appreciated.  Lungs: Normal work of breathing. Clear bilaterally without rales, rhonchi, or wheezing.  Abdomen: Soft, nontender, no masses. Normal bowel sounds. Extremities: No clubbing, cyanosis, erythema. No edema.  Skin: Warm and dry. Neurologic: No focal deficits.  Psychiatric: Normal affect and thought content.   . Reviewed expectations re: course of current medical issues. . Discussed self-management of symptoms. . Outlined signs and symptoms indicating need for  more acute intervention. . Patient verbalized understanding and all questions were answered. Marland Kitchen Health Maintenance issues including appropriate healthy diet, exercise, and smoking avoidance were discussed with patient. . See orders for this visit as documented in the electronic medical record. . Patient received an After Visit Summary.  Briscoe Deutscher, DO [place], [facility] [place] [facility] 01/17/2019

## 2019-01-17 ENCOUNTER — Ambulatory Visit (INDEPENDENT_AMBULATORY_CARE_PROVIDER_SITE_OTHER): Payer: BLUE CROSS/BLUE SHIELD | Admitting: Family Medicine

## 2019-01-17 ENCOUNTER — Other Ambulatory Visit: Payer: Self-pay

## 2019-01-17 ENCOUNTER — Encounter: Payer: Self-pay | Admitting: Family Medicine

## 2019-01-17 VITALS — BP 120/62 | HR 74 | Temp 98.5°F | Ht 63.0 in | Wt 135.0 lb

## 2019-01-17 DIAGNOSIS — F4321 Adjustment disorder with depressed mood: Secondary | ICD-10-CM | POA: Diagnosis not present

## 2019-01-17 DIAGNOSIS — F418 Other specified anxiety disorders: Secondary | ICD-10-CM

## 2019-01-17 NOTE — Patient Instructions (Signed)
Hand Washing Germs such as bacteria, viruses, and parasites are found everywhere. They can be in the air and water. They can also be on surfaces like food, door handles, and your skin. Every day, your hands touch germs. Many of these germs can make you and your family sick. Washing your hands is one of the best ways to lower your risk of getting and sharing germs. When should I wash my hands? You should wash your hands whenever you think they are dirty. You should also wash your hands:  Before: ? Visiting a baby or anyone with a weakened disease-fighting system (immunesystem). ? Putting in and taking out contact lenses.  After: ? Using the bathroom or helping someone else use the bathroom. ? Working or playing outside. ? Touching or taking out the garbage. ? Touching anything dirty around your home. ? Sneezing, coughing, or blowing your nose. ? Using a phone, including your mobile phone. ? Touching an animal, animal food, animal poop, or its toys or leash. ? Touching money. ? Using household cleaners or poisonous chemicals. ? Handling dirty clothes, bedding, or rags. ? Using public transportation. ? Going shopping, especially if you use a shopping cart or basket. ? Shaking hands. ? Handling livestock, such as cows or sheep.  Before and after: ? Preparing food. ? Eating. ? Visiting or taking care of someone who is sick. This includes touching used tissues, toys, and clothes. ? Changing a bandage (dressing). ? Taking care of an injury or wound. ? Giving or taking medicine. ? Preparing a bottle for a baby. ? Feeding a baby or young child. ? Changing a diaper. What is the right way to wash my hands?  1. Wet your hands with clean, running water. Turn off the water or move your hands out of the running water. 2. Apply liquid soap or bar soap to your hands. 3. Rub your hands together quickly to create lather. 4. Keep rubbing your hands together for at least 20 seconds. Thoroughly  scrub all parts of your hands. This includes scrubbing under your fingernails and between your fingers. 5. Rinse your hands with clean, running water. Do this until all the soap is gone. 6. Dry your hands using an air dryer or a clean paper or cloth towel, or let your hands air-dry. Do not use your clothing or a dirty towel to dry your hands. If you are in a public restroom, use your towel:  To turn off the water faucet.  To open the bathroom door. How can I clean my hands if I do not have soap and water?  If soap and clean water are not available, use a hand-washing wipe, spray, or gel (hand sanitizer). Use one that contains at least 60% alcohol. If you are handling food, gels are not recommended as a replacement for hand washing with soap and water. To use these products, follow the directions on the product, and:  Apply enough product to cover your hands.  Make sure you wipe, rub, or spray the product so that it reaches every part of your hands and wrists. Include the backs of your hands, between your fingers, and under your fingernails.  Rub the product onto your hands until it dries. Summary  Every day, your hands touch germs. Many of these germs can make you and your family sick.  Washing your hands is one of the best ways to lower your risk of getting and sharing germs.  If soap and clean water are not available,   use a hand-washing wipe, spray, or gel. This information is not intended to replace advice given to you by your health care provider. Make sure you discuss any questions you have with your health care provider. Document Released: 10/02/2008 Document Revised: 07/29/2017 Document Reviewed: 07/29/2017 Elsevier Interactive Patient Education  2019 [employer]  

## 2019-03-23 ENCOUNTER — Encounter: Payer: Self-pay | Admitting: Family Medicine

## 2019-03-23 DIAGNOSIS — F4321 Adjustment disorder with depressed mood: Secondary | ICD-10-CM

## 2019-03-23 MED ORDER — SERTRALINE HCL 50 MG PO TABS: 50.0000 mg | ORAL_TABLET | Freq: Every day | ORAL | 0 refills | Status: DC

## 2019-03-23 MED ORDER — SERTRALINE HCL 50 MG PO TABS: 50.0000 mg | ORAL_TABLET | Freq: Every day | ORAL | 1 refills | Status: DC

## 2019-03-30 ENCOUNTER — Telehealth: Payer: BLUE CROSS/BLUE SHIELD | Admitting: Family

## 2019-03-30 DIAGNOSIS — N898 Other specified noninflammatory disorders of vagina: Secondary | ICD-10-CM | POA: Diagnosis not present

## 2019-03-30 MED ORDER — FLUCONAZOLE 150 MG PO TABS: 150.0000 mg | ORAL_TABLET | ORAL | 0 refills | Status: DC | PRN

## 2019-03-30 NOTE — Progress Notes (Signed)
We are sorry that you are not feeling well. Here is how we plan to help! Based on what you shared with me it looks like you: May have a yeast vaginosis  Vaginosis is an inflammation of the vagina that can result in discharge, itching and pain. The cause is usually a change in the normal balance of vaginal bacteria or an infection. Vaginosis can also result from reduced estrogen levels after menopause.  The most common causes of vaginosis are:   Bacterial vaginosis which results from an overgrowth of one on several organisms that are normally present in your vagina.   Yeast infections which are caused by a naturally occurring fungus called candida.   Vaginal atrophy (atrophic vaginosis) which results from the thinning of the vagina from reduced estrogen levels after menopause.   Trichomoniasis which is caused by a parasite and is commonly transmitted by sexual intercourse.  Factors that increase your risk of developing vaginosis include: Marland Kitchen Medications, such as antibiotics and steroids . Uncontrolled diabetes . Use of hygiene products such as bubble bath, vaginal spray or vaginal deodorant . Douching . Wearing damp or tight-fitting clothing . Using an intrauterine device (IUD) for birth control . Hormonal changes, such as those associated with pregnancy, birth control pills or menopause . Sexual activity . Having a sexually transmitted infection  Your treatment plan is A single Diflucan (fluconazole) 150mg  tablet once.  I have electronically sent this prescription into the pharmacy that you have chosen.   Approximately 5 minutes was spent documenting and reviewing patient's chart.    If the smell of your urine worsens or you develop other UTI symptoms you need to follow up with your PCP. You usually do not have a strong urine with BV, more strong/fishy smell from the discharge.    Approximately 5 minutes was spent documenting and reviewing patient's chart.   Be sure to take all of  the medication as directed. Stop taking any medication if you develop a rash, tongue swelling or shortness of breath. Mothers who are breast feeding should consider pumping and discarding their breast milk while on these antibiotics. However, there is no consensus that infant exposure at these doses would be harmful.  Remember that medication creams can weaken latex condoms. Marland Kitchen   HOME CARE:  Good hygiene may prevent some types of vaginosis from recurring and may relieve some symptoms:  . Avoid baths, hot tubs and whirlpool spas. Rinse soap from your outer genital area after a shower, and dry the area well to prevent irritation. Don't use scented or harsh soaps, such as those with deodorant or antibacterial action. Marland Kitchen Avoid irritants. These include scented tampons and pads. . Wipe from front to back after using the toilet. Doing so avoids spreading fecal bacteria to your vagina.  Other things that may help prevent vaginosis include:  Marland Kitchen Don't douche. Your vagina doesn't require cleansing other than normal bathing. Repetitive douching disrupts the normal organisms that reside in the vagina and can actually increase your risk of vaginal infection. Douching won't clear up a vaginal infection. . Use a latex condom. Both female and female latex condoms may help you avoid infections spread by sexual contact. . Wear cotton underwear. Also wear pantyhose with a cotton crotch. If you feel comfortable without it, skip wearing underwear to bed. Yeast thrives in [employer] Your symptoms should improve in the next day or two.  GET HELP RIGHT AWAY IF:  . You have pain in your lower abdomen ( pelvic area  or over your ovaries) . You develop nausea or vomiting . You develop a fever . Your discharge changes or worsens . You have persistent pain with intercourse . You develop shortness of breath, a rapid pulse, or you faint.  These symptoms could be signs of problems or infections that need to be  evaluated by a medical provider now.  MAKE SURE YOU    Understand these instructions.  Will watch your condition.  Will get help right away if you are not doing well or get worse.  Your e-visit answers were reviewed by a board certified advanced clinical practitioner to complete your personal care plan. Depending upon the condition, your plan could have included both over the counter or prescription medications. Please review your pharmacy choice to make sure that you have choses a pharmacy that is open for you to pick up any needed prescription, Your safety is important to [place]. If you have drug allergies check your prescription carefully.   You can use MyChart to ask questions about today's visit, request a non-urgent call back, or ask for a work or school excuse for 24 hours related to this e-Visit. If it has been greater than 24 hours you will need to follow up with your provider, or enter a new e-Visit to address those concerns. You will get a MyChart message within the next two days asking about your experience. I hope that your e-visit has been valuable and will speed your recovery.

## 2019-04-04 ENCOUNTER — Other Ambulatory Visit (INDEPENDENT_AMBULATORY_CARE_PROVIDER_SITE_OTHER): Payer: BLUE CROSS/BLUE SHIELD

## 2019-04-04 ENCOUNTER — Encounter: Payer: Self-pay | Admitting: Family Medicine

## 2019-04-04 ENCOUNTER — Other Ambulatory Visit: Payer: Self-pay

## 2019-04-04 ENCOUNTER — Other Ambulatory Visit: Payer: Self-pay | Admitting: Physical Therapy

## 2019-04-04 DIAGNOSIS — R399 Unspecified symptoms and signs involving the genitourinary system: Secondary | ICD-10-CM | POA: Diagnosis not present

## 2019-04-04 DIAGNOSIS — Z8611 Personal history of tuberculosis: Secondary | ICD-10-CM | POA: Diagnosis not present

## 2019-04-04 DIAGNOSIS — Z7251 High risk heterosexual behavior: Secondary | ICD-10-CM | POA: Diagnosis not present

## 2019-04-05 ENCOUNTER — Encounter: Payer: Self-pay | Admitting: Physician Assistant

## 2019-04-05 ENCOUNTER — Ambulatory Visit (INDEPENDENT_AMBULATORY_CARE_PROVIDER_SITE_OTHER): Payer: BC Managed Care – PPO | Admitting: Physician Assistant

## 2019-04-05 VITALS — Ht 63.0 in | Wt 136.0 lb

## 2019-04-05 DIAGNOSIS — R300 Dysuria: Secondary | ICD-10-CM

## 2019-04-05 LAB — URINALYSIS, MICROSCOPIC ONLY

## 2019-04-05 MED ORDER — CEPHALEXIN 500 MG PO CAPS: 500.0000 mg | ORAL_CAPSULE | Freq: Three times a day (TID) | ORAL | 0 refills | Status: AC

## 2019-04-05 NOTE — Progress Notes (Signed)
Virtual Visit via Video   I connected with Sarah Lyons on 04/05/19 at  8:20 AM EDT by a video enabled telemedicine application and verified that I am speaking with the correct person using two identifiers. Location patient: Home Location provider: [facility], Office Persons participating in the virtual visit: Sarah Lyons, Sarah Waszak PA-C, [place], [place] discussed the limitations of evaluation and management by telemedicine and the availability of in person appointments. The patient expressed understanding and agreed to proceed.  I,Sarah Lyons,acting as a Education administrator for [employer], PA.,have documented all relevant documentation on the behalf of Sarah Coke, PA,as directed by  Sarah Coke, PA while in the presence of Sarah Lyons, [place].   Subjective:   HPI:   Dysuria Sx x 1 week. C/o cloudy urine with abnormal odor. She reports increased urgency and incomplete void. Denies flank pain, pelvic pain/pressure, fever, hematuria. She has not tried anything OTC. Sx are not worsening. She is working on pushing fluids.  Patient's last menstrual period was 03/14/2019 (lmp unknown).   ROS: See pertinent positives and negatives per HPI.  Patient Active Problem List   Diagnosis Date Noted  . Situational depression 01/17/2019  . Situational anxiety 11/17/2018  . Migraine without aura 08/30/2007    Social History   Tobacco Use  . Smoking status: Never Smoker  . Smokeless tobacco: Never Used  Substance Use Topics  . Alcohol use: Yes    Alcohol/week: 7.0 standard drinks    Types: 4 Glasses of wine, 3 Cans of beer per week    Current Outpatient Medications:  .  ALPRAZolam (XANAX) 0.5 MG tablet, Take 1 tablet (0.5 mg total) by mouth 2 (two) times daily as needed for anxiety., Disp: 60 tablet, Rfl: 0 .  levonorgestrel (MIRENA) 20 MCG/24HR IUD, 1 each by Intrauterine route once., Disp: , Rfl:  .  sertraline (ZOLOFT) 50 MG tablet, Take 1  tablet (50 mg total) by mouth at bedtime., Disp: 30 tablet, Rfl: 0 .  cephALEXin (KEFLEX) 500 MG capsule, Take 1 capsule (500 mg total) by mouth 3 (three) times daily for 5 days., Disp: 15 capsule, Rfl: 0  Allergies  Allergen Reactions  . Sulfonamide Derivatives     REACTION: RASH AS CHILD    Objective:   VITALS: Per patient if applicable, see vitals. GENERAL: Alert, appears well and in no acute distress. HEENT: Atraumatic, conjunctiva clear, no obvious abnormalities on inspection of external nose and ears. NECK: Normal movements of the head and neck. CARDIOPULMONARY: No increased WOB. Speaking in clear sentences. I:E ratio WNL.  MS: Moves all visible extremities without noticeable abnormality. PSYCH: Pleasant and cooperative, well-groomed. Speech normal rate and rhythm. Affect is appropriate. Insight and judgement are appropriate. Attention is focused, linear, and appropriate.  NEURO: CN grossly intact. Oriented as arrived to appointment on time with no prompting. Moves both UE equally.  SKIN: No obvious lesions, wounds, erythema, or cyanosis noted on face or hands.  Assessment and Plan:   Sarah Lyons was seen today for dysuria.  Diagnoses and all orders for this visit:  Dysuria Urine studies pending. Will send in Keflex 500 mg TID for her to start while we await culture results. Worsening precautions advised.  Other orders -     cephALEXin (KEFLEX) 500 MG capsule; Take 1 capsule (500 mg total) by mouth 3 (three) times daily for 5 days.  . Reviewed expectations re: course of current medical issues. . Discussed self-management of symptoms. . Outlined signs and  symptoms indicating need for more acute intervention. . Patient verbalized understanding and all questions were answered. Marland Kitchen Health Maintenance issues including appropriate healthy diet, exercise, and smoking avoidance were discussed with patient. . See orders for this visit as documented in the electronic medical record.  I  discussed the assessment and treatment plan with the patient. The patient was provided an opportunity to ask questions and all were answered. The patient agreed with the plan and demonstrated an understanding of the instructions.   The patient was advised to call back or seek an in-person evaluation if the symptoms worsen or if the condition fails to improve as anticipated.   [name] or LPN served as scribe during this visit. History, Physical, and Plan performed by medical provider. The above documentation has been reviewed and is accurate and complete.  [place], [place] 04/05/2019

## 2019-04-07 LAB — URINE CULTURE
MICRO NUMBER:: 523925
SPECIMEN QUALITY:: ADEQUATE

## 2019-05-13 DIAGNOSIS — B078 Other viral warts: Secondary | ICD-10-CM | POA: Diagnosis not present

## 2019-05-13 DIAGNOSIS — D225 Melanocytic nevi of trunk: Secondary | ICD-10-CM | POA: Diagnosis not present

## 2019-05-13 DIAGNOSIS — Z1283 Encounter for screening for malignant neoplasm of skin: Secondary | ICD-10-CM | POA: Diagnosis not present

## 2019-05-13 DIAGNOSIS — L821 Other seborrheic keratosis: Secondary | ICD-10-CM | POA: Diagnosis not present

## 2019-05-13 DIAGNOSIS — L739 Follicular disorder, unspecified: Secondary | ICD-10-CM | POA: Diagnosis not present

## 2019-08-31 ENCOUNTER — Encounter: Payer: Self-pay | Admitting: Family Medicine

## 2019-09-27 ENCOUNTER — Other Ambulatory Visit: Payer: Self-pay | Admitting: Family Medicine

## 2019-09-27 DIAGNOSIS — F4321 Adjustment disorder with depressed mood: Secondary | ICD-10-CM

## 2019-11-14 DIAGNOSIS — N6322 Unspecified lump in the left breast, upper inner quadrant: Secondary | ICD-10-CM | POA: Diagnosis not present

## 2019-11-17 ENCOUNTER — Other Ambulatory Visit: Payer: Self-pay | Admitting: Obstetrics and Gynecology

## 2019-11-17 DIAGNOSIS — N6320 Unspecified lump in the left breast, unspecified quadrant: Secondary | ICD-10-CM

## 2019-11-23 ENCOUNTER — Other Ambulatory Visit: Payer: BC Managed Care – PPO

## 2019-11-25 ENCOUNTER — Ambulatory Visit
Admission: RE | Admit: 2019-11-25 | Discharge: 2019-11-25 | Disposition: A | Discharge status: Home / Self Care | Payer: BC Managed Care – PPO | Source: Ambulatory Visit | Attending: Obstetrics and Gynecology | Admitting: Obstetrics and Gynecology

## 2019-11-25 ENCOUNTER — Other Ambulatory Visit: Payer: Self-pay

## 2019-11-25 DIAGNOSIS — N6320 Unspecified lump in the left breast, unspecified quadrant: Secondary | ICD-10-CM | POA: Diagnosis not present

## 2019-11-25 DIAGNOSIS — N6002 Solitary cyst of left breast: Secondary | ICD-10-CM | POA: Diagnosis not present

## 2019-11-25 DIAGNOSIS — R922 Inconclusive mammogram: Secondary | ICD-10-CM | POA: Diagnosis not present

## 2019-11-28 ENCOUNTER — Other Ambulatory Visit: Payer: Self-pay | Admitting: Obstetrics and Gynecology

## 2019-11-29 ENCOUNTER — Other Ambulatory Visit: Payer: Self-pay | Admitting: Obstetrics and Gynecology

## 2019-11-29 DIAGNOSIS — R921 Mammographic calcification found on diagnostic imaging of breast: Secondary | ICD-10-CM

## 2019-11-29 DIAGNOSIS — N6320 Unspecified lump in the left breast, unspecified quadrant: Secondary | ICD-10-CM

## 2019-11-29 DIAGNOSIS — R928 Other abnormal and inconclusive findings on diagnostic imaging of breast: Secondary | ICD-10-CM

## 2019-12-01 ENCOUNTER — Ambulatory Visit
Admission: RE | Admit: 2019-12-01 | Discharge: 2019-12-01 | Disposition: A | Discharge status: Home / Self Care | Payer: BC Managed Care – PPO | Source: Ambulatory Visit | Attending: Obstetrics and Gynecology | Admitting: Obstetrics and Gynecology

## 2019-12-01 ENCOUNTER — Other Ambulatory Visit: Payer: BC Managed Care – PPO

## 2019-12-01 ENCOUNTER — Other Ambulatory Visit: Payer: Self-pay | Admitting: Obstetrics and Gynecology

## 2019-12-01 DIAGNOSIS — R921 Mammographic calcification found on diagnostic imaging of breast: Secondary | ICD-10-CM

## 2019-12-01 DIAGNOSIS — R928 Other abnormal and inconclusive findings on diagnostic imaging of breast: Secondary | ICD-10-CM

## 2019-12-01 DIAGNOSIS — N6320 Unspecified lump in the left breast, unspecified quadrant: Secondary | ICD-10-CM | POA: Diagnosis not present

## 2019-12-01 DIAGNOSIS — N6002 Solitary cyst of left breast: Secondary | ICD-10-CM | POA: Diagnosis not present

## 2019-12-01 HISTORY — PX: BREAST BIOPSY: SHX20

## 2019-12-02 ENCOUNTER — Other Ambulatory Visit: Payer: BC Managed Care – PPO

## 2019-12-02 LAB — SURGICAL PATHOLOGY

## 2019-12-07 ENCOUNTER — Encounter (INDEPENDENT_AMBULATORY_CARE_PROVIDER_SITE_OTHER): Payer: Self-pay | Admitting: Family Medicine

## 2019-12-08 NOTE — Telephone Encounter (Signed)
Called pt and scheduled TOC

## 2019-12-08 NOTE — Telephone Encounter (Signed)
Please review

## 2019-12-26 ENCOUNTER — Other Ambulatory Visit: Payer: Self-pay | Admitting: Family Medicine

## 2019-12-26 DIAGNOSIS — F4321 Adjustment disorder with depressed mood: Secondary | ICD-10-CM

## 2020-01-25 DIAGNOSIS — Z30433 Encounter for removal and reinsertion of intrauterine contraceptive device: Secondary | ICD-10-CM | POA: Diagnosis not present

## 2020-01-25 DIAGNOSIS — Z01419 Encounter for gynecological examination (general) (routine) without abnormal findings: Secondary | ICD-10-CM | POA: Diagnosis not present

## 2020-01-25 DIAGNOSIS — Z6825 Body mass index (BMI) 25.0-25.9, adult: Secondary | ICD-10-CM | POA: Diagnosis not present

## 2020-03-01 DIAGNOSIS — Z30431 Encounter for routine checking of intrauterine contraceptive device: Secondary | ICD-10-CM | POA: Diagnosis not present

## 2020-04-04 ENCOUNTER — Ambulatory Visit (INDEPENDENT_AMBULATORY_CARE_PROVIDER_SITE_OTHER): Payer: 59 | Admitting: Family Medicine

## 2020-04-04 ENCOUNTER — Encounter: Payer: Self-pay | Admitting: Family Medicine

## 2020-04-04 ENCOUNTER — Other Ambulatory Visit: Payer: Self-pay

## 2020-04-04 VITALS — BP 112/68 | HR 79 | Temp 97.9°F | Resp 15 | Wt 152.4 lb

## 2020-04-04 DIAGNOSIS — M79605 Pain in left leg: Secondary | ICD-10-CM

## 2020-04-04 DIAGNOSIS — Z0000 Encounter for general adult medical examination without abnormal findings: Secondary | ICD-10-CM | POA: Diagnosis not present

## 2020-04-04 DIAGNOSIS — F418 Other specified anxiety disorders: Secondary | ICD-10-CM

## 2020-04-04 DIAGNOSIS — Z975 Presence of (intrauterine) contraceptive device: Secondary | ICD-10-CM | POA: Diagnosis not present

## 2020-04-04 NOTE — Progress Notes (Signed)
Subjective  Chief Complaint  Patient presents with  . Transitions Of Care  . Leg Pain    pain below the knee, on and off for 3 weeks, denies any injury     HPI: Sarah Lyons is a 34 y.o. female who presents to [facility] [place] today for a Female Wellness Visit.  She also has the concerns and/or needs as listed above in the chief complaint. These will be addressed in addition to the Health Maintenance Visit.   Wellness Visit: annual visit with health maintenance review and exam without Pap   HM: sees Dr Ronita Hipps for female wellness care and is up to date. Had mirena IUD replaced about 6 weeks ago. Doing well. Recently divorced, mother of a 25 and 31 yo.    Chronic disease management visit and/or acute problem visit:  Anxiety: on zoloft for > 1 year now and doing well. Separation and divorce were difficult but she has come through it ok. Working parrtime from home. No AEs. Mom has h/o depression / anxiety but pt never before had dx.  Depression screen [facility] 2/9 04/04/2020 11/17/2018 05/17/2018 12/16/2017  Decreased Interest 0 1 0 0  Down, Depressed, Hopeless 0 1 0 0  PHQ - 2 Score 0 2 0 0  Altered sleeping - 2 0 -  Tired, decreased energy - 2 2 -  Change in appetite - 2 1 -  Feeling bad or failure about yourself  - 1 1 -  Trouble concentrating - 3 0 -  Moving slowly or fidgety/restless - 2 0 -  Suicidal thoughts - 0 0 -  PHQ-9 Score - 14 4 -  Difficult doing work/chores - Somewhat difficult Not difficult at all -    2-3 weeks of mild intermittent and "strange" pain that feels like a rubber band is around leg just below left knee. Happens occasionally with walking. Has started pilates recently but no known injury. No knee swelling. No calf pain. No palpable mass.   Assessment  1. Annual physical exam   2. IUD (intrauterine device) in place   3. Situational anxiety   4. Pain of left lower extremity      Plan  Female Wellness Visit:  Age appropriate  Health Maintenance and Prevention measures were discussed with patient. Included topics are cancer screening recommendations, ways to keep healthy (see AVS) including dietary and exercise recommendations, regular eye and dental care, use of seat belts, and avoidance of moderate alcohol use and tobacco use. Screens up to date  BMI: discussed patient's BMI and encouraged positive lifestyle modifications to help get to or maintain a target BMI.  HM needs and immunizations were addressed and ordered. See below for orders. See HM and immunization section for updates. utd  Routine labs and screening tests ordered including cmp, cbc and lipids where appropriate.  Discussed recommendations regarding Vit D and calcium supplementation (see AVS)  Chronic disease f/u and/or acute problem visit: (deemed necessary to be done in addition to the wellness visit):  anxiety:  Well controlled. Continue ssri for next 6-12 months, then reevaluate  Pain: unclear etiology. Normal exam. Trial of nsaids and monitor. She will return if persists or worsens for reevaluation and imaging.   Follow up: Return in about 1 year (around 04/04/2021) for complete physical.   Orders Placed This Encounter  Procedures  . CBC with Differential/Platelet  . Comprehensive metabolic panel  . Lipid panel  . HIV Antibody (routine testing w rflx)  No orders of the defined types were placed in this encounter.     Lifestyle: Body mass index is 27 kg/m. Wt Readings from Last 3 Encounters:  04/04/20 152 lb 6.4 oz (69.1 kg)  04/05/19 136 lb (61.7 kg)  01/17/19 135 lb (61.2 kg)     Patient Active Problem List   Diagnosis Date Noted  . IUD (intrauterine device) in place 04/04/2020    Mirena #2, placed 2020 Dr. Ronita Hipps   . Situational depression 01/17/2019  . Situational anxiety 11/17/2018  . Migraine without aura 08/30/2007   Health Maintenance  Topic Date Due  . INFLUENZA VACCINE  06/03/2020  . TETANUS/TDAP  11/25/2020   . PAP SMEAR-Modifier  11/30/2022  . HIV Screening  Completed   Immunization History  Administered Date(s) Administered  . Influenza,inj,Quad PF,6+ Mos 07/08/2016, 08/04/2018  . Influenza-Unspecified 07/14/2018, 08/01/2019   We updated and reviewed the patient's past history in detail and it is documented below. Allergies: Patient  reports current alcohol use of about 7.0 standard drinks of alcohol per week. Past Medical History Patient  has a past medical history of Anemia, Anxiety, and Iron deficiency anemia (07/30/2007). Past Surgical History Patient  has a past surgical history that includes Mouth surgery; Cholecystectomy (N/A, 09/05/2015); Breast biopsy (Left, 12/01/2019); and Breast biopsy (Left, 12/01/2019). Social History   Socioeconomic History  . Marital status: Divorced    Spouse name: Not on file  . Number of children: 2  . Years of education: Not on file  . Highest education level: Not on file  Occupational History  . Occupation: Psychologist, occupational:   Tobacco Use  . Smoking status: Never Smoker  . Smokeless tobacco: Never Used  Substance and Sexual Activity  . Alcohol use: Yes    Alcohol/week: 7.0 standard drinks    Types: 4 Glasses of wine, 3 Cans of beer per week  . Drug use: No  . Sexual activity: Yes    Birth control/protection: I.U.D.  Other Topics Concern  . Not on file  Social History Narrative   From [place] originally   Social Determinants of Health   Financial Resource Strain:   . Difficulty of Paying Living Expenses:   Food Insecurity:   . Worried About Charity fundraiser in the Last Year:   . Arboriculturist in the Last Year:   Transportation Needs:   . Film/video editor (Medical):   Marland Kitchen Lack of Transportation (Non-Medical):   Physical Activity:   . Days of Exercise per Week:   . Minutes of Exercise per Session:   Stress:   . Feeling of Stress :   Social Connections:   . Frequency of Communication with Friends and  Family:   . Frequency of Social Gatherings with Friends and Family:   . Attends Religious Services:   . Active Member of Clubs or Organizations:   . Attends Archivist Meetings:   Marland Kitchen Marital Status:    Family History  Problem Relation Age of Onset  . Breast cancer Paternal Grandmother   . Depression Mother   . Prostate cancer Father   . Hypertension Father   . Healthy Daughter   . Healthy Son   . Birth defects Maternal Grandfather        ? sarcoma  . Diabetes Neg Hx   . Hyperlipidemia Neg Hx     Review of Systems: Constitutional: negative for fever or malaise Ophthalmic: negative for photophobia, double vision or loss of  vision Cardiovascular: negative for chest pain, dyspnea on exertion, or new LE swelling Respiratory: negative for SOB or persistent cough Gastrointestinal: negative for abdominal pain, change in bowel habits or melena Genitourinary: negative for dysuria or gross hematuria, no abnormal uterine bleeding or disharge Musculoskeletal: negative for new gait disturbance or muscular weakness Integumentary: negative for new or persistent rashes, no breast lumps Neurological: negative for TIA or stroke symptoms Psychiatric: negative for SI or delusions Allergic/Immunologic: negative for hives  Patient Care Team    Relationship Specialty Notifications Start End  Leamon Arnt, MD PCP - General Family Medicine  04/04/20   Brien Few, MD Consulting Physician Obstetrics and Gynecology  05/17/18     Objective  Vitals: BP 112/68   Pulse 79   Temp 97.9 F (36.6 C) (Temporal)   Resp 15   Wt 152 lb 6.4 oz (69.1 kg)   SpO2 99%   BMI 27.00 kg/m  General:  Well developed, well nourished, no acute distress  Psych:  Alert and orientedx3,normal mood and affect HEENT:  Normocephalic, atraumatic, non-icteric sclera, PERRL, supple neck without adenopathy, mass or thyromegaly Cardiovascular:  Normal S1, S2, RRR without gallop, rub or murmur Respiratory:  Good  breath sounds bilaterally, CTAB with normal respiratory effort Gastrointestinal: normal bowel sounds, soft, non-tender, no noted masses. No HSM MSK: no deformities, contusions. Joints are without erythema or swelling. Left knee is normal. From, nontender. No effusion. No pain with jumping or squatting, no calf ttp or mass Skin:  Warm, no rashes or suspicious lesions noted Neurologic:    Mental status is normal. Gross motor and sensory exams are normal. Normal gait. No tremor     Commons side effects, risks, benefits, and alternatives for medications and treatment plan prescribed today were discussed, and the patient expressed understanding of the given instructions. Patient is instructed to call or message via MyChart if he/she has any questions or concerns regarding our treatment plan. No barriers to understanding were identified. We discussed Red Flag symptoms and signs in detail. Patient expressed understanding regarding what to do in case of urgent or emergency type symptoms.   Medication list was reconciled, printed and provided to the patient in AVS. Patient instructions and summary information was reviewed with the patient as documented in the AVS. This note was prepared with assistance of Dragon voice recognition software. Occasional wrong-word or sound-a-like substitutions may have occurred due to the inherent limitations of voice recognition software  This visit occurred during the SARS-CoV-2 public health emergency.  Safety protocols were in place, including screening questions prior to the visit, additional usage of staff PPE, and extensive cleaning of exam room while observing appropriate contact time as indicated for disinfecting solutions.

## 2020-04-04 NOTE — Patient Instructions (Signed)
Please return in 12 months for your annual complete physical; please come fasting.  I will release your lab results to you on your MyChart account with further instructions. Please reply with any questions.   It was a pleasure meeting you today! Thank you for choosing us to meet your healthcare needs! I truly look forward to working with you. If you have any questions or concerns, please send me a message via Mychart or call the office at [phone].  Please do these things to maintain good health!   Exercise at least 30-45 minutes a day,  4-5 days a week.   Eat a low-fat diet with lots of fruits and vegetables, up to 7-9 servings per day.  Drink plenty of water daily. Try to drink 8 8oz glasses per day.  Seatbelts can save your life. Always wear your seatbelt.  Place Smoke Detectors on every level of your home and check batteries every year.  Schedule an appointment with an eye doctor for an eye exam every 1-2 years  Safe sex - use condoms to protect yourself from STDs if you could be exposed to these types of infections. Use birth control if you do not want to become pregnant and are sexually active.  Avoid heavy alcohol use. If you drink, keep it to less than 2 drinks/day and not every day.  Health Care Power of Attorney.  Choose someone you trust that could speak for you if you became unable to speak for yourself.  Depression is common in our stressful world.If you're feeling down or losing interest in things you normally enjoy, please come in for a visit.  If anyone is threatening or hurting you, please get help. Physical or Emotional Violence is never OK.   

## 2020-04-05 LAB — CBC WITH DIFFERENTIAL/PLATELET
Basophils Absolute: 0.1 10*3/uL (ref 0.0–0.1)
Basophils Relative: 1.2 % (ref 0.0–3.0)
Eosinophils Absolute: 0.2 10*3/uL (ref 0.0–0.7)
Eosinophils Relative: 2.1 % (ref 0.0–5.0)
HCT: 37.4 % (ref 36.0–46.0)
Hemoglobin: 12.5 g/dL (ref 12.0–15.0)
Lymphocytes Relative: 30.4 % (ref 12.0–46.0)
Lymphs Abs: 2.4 10*3/uL (ref 0.7–4.0)
MCHC: 33.3 g/dL (ref 30.0–36.0)
MCV: 86 fl (ref 78.0–100.0)
Monocytes Absolute: 0.8 10*3/uL (ref 0.1–1.0)
Monocytes Relative: 10.8 % (ref 3.0–12.0)
Neutro Abs: 4.3 10*3/uL (ref 1.4–7.7)
Neutrophils Relative %: 55.5 % (ref 43.0–77.0)
Platelets: 251 10*3/uL (ref 150.0–400.0)
RBC: 4.35 Mil/uL (ref 3.87–5.11)
RDW: 13.7 % (ref 11.5–15.5)
WBC: 7.8 10*3/uL (ref 4.0–10.5)

## 2020-04-05 LAB — LIPID PANEL
Cholesterol: 166 mg/dL (ref 0–200)
HDL: 69.8 mg/dL (ref >39.00)
LDL Cholesterol: 78 mg/dL (ref 0–99)
NonHDL: 96.12
Total CHOL/HDL Ratio: 2
Triglycerides: 89 mg/dL (ref 0.0–149.0)
VLDL: 17.8 mg/dL (ref 0.0–40.0)

## 2020-04-05 LAB — HIV ANTIBODY (ROUTINE TESTING W REFLEX): HIV 1&2 Ab, 4th Generation: NONREACTIVE

## 2020-04-05 LAB — COMPREHENSIVE METABOLIC PANEL
ALT: 14 U/L (ref 0–35)
AST: 19 U/L (ref 0–37)
Albumin: 4.6 g/dL (ref 3.5–5.2)
Alkaline Phosphatase: 40 U/L (ref 39–117)
BUN: 15 mg/dL (ref 6–23)
CO2: 28 mEq/L (ref 19–32)
Calcium: 9.7 mg/dL (ref 8.4–10.5)
Chloride: 101 mEq/L (ref 96–112)
Creatinine, Ser: 0.83 mg/dL (ref 0.40–1.20)
GFR: 77.8 mL/min (ref >60.00)
Glucose, Bld: 92 mg/dL (ref 70–99)
Potassium: 4.5 mEq/L (ref 3.5–5.1)
Sodium: 135 mEq/L (ref 135–145)
Total Bilirubin: 0.4 mg/dL (ref 0.2–1.2)
Total Protein: 7.4 g/dL (ref 6.0–8.3)

## 2020-04-21 ENCOUNTER — Encounter: Payer: Self-pay | Admitting: Family Medicine

## 2020-04-25 ENCOUNTER — Other Ambulatory Visit (HOSPITAL_COMMUNITY)
Admission: RE | Admit: 2020-04-25 | Discharge: 2020-04-25 | Disposition: A | Discharge status: Home / Self Care | Payer: 59 | Source: Ambulatory Visit | Attending: Family Medicine | Admitting: Family Medicine

## 2020-04-25 ENCOUNTER — Ambulatory Visit (INDEPENDENT_AMBULATORY_CARE_PROVIDER_SITE_OTHER): Payer: 59 | Admitting: Physician Assistant

## 2020-04-25 ENCOUNTER — Encounter: Payer: Self-pay | Admitting: Physician Assistant

## 2020-04-25 VITALS — BP 110/72 | HR 72 | Temp 98.3°F | Ht 63.0 in | Wt 153.8 lb

## 2020-04-25 DIAGNOSIS — Z202 Contact with and (suspected) exposure to infections with a predominantly sexual mode of transmission: Secondary | ICD-10-CM | POA: Diagnosis not present

## 2020-04-25 NOTE — Progress Notes (Signed)
Sarah Lyons is a 34 y.o. female here for STD testing.  I acted as a Education administrator for [employer], PA-C [facility], [place]  History of Present Illness:   Chief Complaint  Patient presents with  . STD symptoms    HPI   STD symptoms Patient request to get STD tested. No current symptoms. Patient's new boyfriend had symptoms of UTI but was tested negative for UTI, gonorrhea, chlamydia. Trich test is pending at this time. Spouse's symptoms went away after treatments of antibiotics.  Recently started using condoms with this partner.   Past Medical History:  Diagnosis Date  . Anemia    hx  . Anxiety   . Iron deficiency anemia 07/30/2007     Social History   Tobacco Use  . Smoking status: Never Smoker  . Smokeless tobacco: Never Used  Substance Use Topics  . Alcohol use: Yes    Alcohol/week: 7.0 standard drinks    Types: 4 Glasses of wine, 3 Cans of beer per week  . Drug use: No    Past Surgical History:  Procedure Laterality Date  . BREAST BIOPSY Left 12/01/2019   affirm bx calcs ribbon marker, path pending  . BREAST BIOPSY Left 12/01/2019   [place] bx mass heart marker path pending  . CHOLECYSTECTOMY N/A 09/05/2015   Procedure: LAPAROSCOPIC CHOLECYSTECTOMY WITH INTRAOPERATIVE CHOLANGIOGRAM;  Surgeon: Autumn Messing III, MD;  Location: [place];  Service: General;  Laterality: N/A;  . MOUTH SURGERY     teeth     Family History  Problem Relation Age of Onset  . Breast cancer Paternal Grandmother   . Depression Mother   . Prostate cancer Father   . Hypertension Father   . Healthy Daughter   . Healthy Son   . Birth defects Maternal Grandfather        ? sarcoma  . Diabetes Neg Hx   . Hyperlipidemia Neg Hx     Allergies  Allergen Reactions  . Sulfonamide Derivatives     REACTION: RASH AS CHILD    Current Medications:   Current Outpatient Medications:  .  ALPRAZolam (XANAX) 0.5 MG tablet, Take 1 tablet (0.5 mg total) by mouth 2 (two) times daily as needed for  anxiety., Disp: 60 tablet, Rfl: 0 .  levonorgestrel (MIRENA) 20 MCG/24HR IUD, 1 each by Intrauterine route once., Disp: , Rfl:  .  sertraline (ZOLOFT) 50 MG tablet, TAKE 1 TABLET AT BEDTIME (NEED TO BE SEEN FOR MORE REFILLS), Disp: 90 tablet, Rfl: 3 .  spironolactone (ALDACTONE) 25 MG tablet, Take 25 mg by mouth daily., Disp: , Rfl:    Review of Systems:   ROS  Negative unless otherwise specified per HPI.  Vitals:   Vitals:   04/25/20 0828  BP: 110/72  Pulse: 72  Temp: 98.3 F (36.8 C)  TempSrc: Temporal  SpO2: 99%  Weight: 153 lb 12.8 oz (69.8 kg)  Height: 5\' 3"  (1.6 m)     Body mass index is 27.24 kg/m.  Physical Exam:   Physical Exam Vitals and nursing note reviewed. Exam conducted with a chaperone present [facility]).  Constitutional:      General: She is not in acute distress.    Appearance: She is well-developed. She is not ill-appearing or toxic-appearing.  Cardiovascular:     Rate and Rhythm: Normal rate and regular rhythm.     Pulses: Normal pulses.     Heart sounds: Normal heart sounds, S1 normal and S2 normal.     Comments: No LE  edema Pulmonary:     Effort: Pulmonary effort is normal.     Breath sounds: Normal breath sounds.  Genitourinary:    Vagina: Bleeding present.     Cervix: Normal.     Uterus: Normal.   Skin:    General: Skin is warm and dry.  Neurological:     Mental Status: She is alert.     GCS: GCS eye subscore is 4. GCS verbal subscore is 5. GCS motor subscore is 6.  Psychiatric:        Speech: Speech normal.        Behavior: Behavior normal. Behavior is cooperative.       Assessment and Plan:   Sarah Lyons was seen today for std symptoms.  Diagnoses and all orders for this visit:  Potential exposure to STD -     Cervicovaginal ancillary only( [place]) -     HIV Antibody (routine testing w rflx) -     RPR   Swab obtained, will also order blood test for HIV/RPR. We decided to defer treatment until results have returned. Will  follow-up via MyChart with results and plan.  . Reviewed expectations re: course of current medical issues. . Discussed self-management of symptoms. . Outlined signs and symptoms indicating need for more acute intervention. . Patient verbalized understanding and all questions were answered. . See orders for this visit as documented in the electronic medical record. . Patient received an After-Visit Summary.  [name] or LPN served as scribe during this visit. History, Physical, and Plan performed by medical provider. The above documentation has been reviewed and is accurate and complete.   [name], PA-C

## 2020-04-25 NOTE — Patient Instructions (Signed)
It was great to see you!  I will be in touch with your results as soon as they have returned.  Take care,  [name] PA-C

## 2020-04-26 LAB — CERVICOVAGINAL ANCILLARY ONLY
Chlamydia: NEGATIVE
Comment: NEGATIVE
Comment: NEGATIVE
Comment: NORMAL
Neisseria Gonorrhea: NEGATIVE
Trichomonas: NEGATIVE

## 2020-04-26 LAB — RPR: RPR Ser Ql: NONREACTIVE

## 2020-04-26 LAB — HIV ANTIBODY (ROUTINE TESTING W REFLEX): HIV 1&2 Ab, 4th Generation: NONREACTIVE

## 2020-05-04 MED FILL — SERTRALINE HCL 50 MG TABS: 50 | 90 days supply | Qty: 90 | Fill #0

## 2020-05-04 MED FILL — SPIRONOLACTONE 100 MG TAB: 100 | 90 days supply | Qty: 90 | Fill #0

## 2020-05-08 ENCOUNTER — Ambulatory Visit (INDEPENDENT_AMBULATORY_CARE_PROVIDER_SITE_OTHER): Payer: 59 | Admitting: Dermatology

## 2020-05-08 ENCOUNTER — Other Ambulatory Visit: Payer: Self-pay

## 2020-05-08 DIAGNOSIS — D229 Melanocytic nevi, unspecified: Secondary | ICD-10-CM

## 2020-05-08 DIAGNOSIS — Z1283 Encounter for screening for malignant neoplasm of skin: Secondary | ICD-10-CM

## 2020-05-08 DIAGNOSIS — L578 Other skin changes due to chronic exposure to nonionizing radiation: Secondary | ICD-10-CM

## 2020-05-08 DIAGNOSIS — L814 Other melanin hyperpigmentation: Secondary | ICD-10-CM | POA: Diagnosis not present

## 2020-05-08 DIAGNOSIS — D1800 Hemangioma unspecified site: Secondary | ICD-10-CM | POA: Diagnosis not present

## 2020-05-08 DIAGNOSIS — D2272 Melanocytic nevi of left lower limb, including hip: Secondary | ICD-10-CM

## 2020-05-08 DIAGNOSIS — L700 Acne vulgaris: Secondary | ICD-10-CM

## 2020-05-08 DIAGNOSIS — L821 Other seborrheic keratosis: Secondary | ICD-10-CM | POA: Diagnosis not present

## 2020-05-08 DIAGNOSIS — D225 Melanocytic nevi of trunk: Secondary | ICD-10-CM | POA: Diagnosis not present

## 2020-05-08 DIAGNOSIS — Z86018 Personal history of other benign neoplasm: Secondary | ICD-10-CM

## 2020-05-08 MED ORDER — SPIRONOLACTONE 50 MG PO TABS: 100.0000 mg | ORAL_TABLET | Freq: Every day | ORAL | 5 refills | Status: DC

## 2020-05-08 NOTE — Progress Notes (Signed)
Follow-Up Visit   Subjective  Sarah Lyons is a 34 y.o. female who presents for the following: Annual Exam (Total body skin exam) and Acne (face, Spironolactone). Takes 25 mg spironolactone, 2 pills a day.  No side effects. Still will get monthly breakouts.  No changing moles that she has noticed.  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   The following portions of the chart were reviewed this encounter and updated as appropriate:      Review of Systems:  No other skin or systemic complaints except as noted in HPI or Assessment and Plan.  Objective  Well appearing patient in no apparent distress; mood and affect are within normal limits.  A full examination was performed including scalp, head, eyes, ears, nose, lips, neck, chest, axillae, abdomen, back, buttocks, bilateral upper extremities, bilateral lower extremities, hands, feet, fingers, toes, fingernails, and toenails. All findings within normal limits unless otherwise noted below.  Objective  Head - Anterior (Face): Violaceous macules cheeks, chin BP today 117/74  Objective    Left Knee - Anterior: 2.52mm dark brown macule  Left 4th webspace, foot: 4.0 x 2.34mm med light brown macule  spinal mid back: 9.71mm med light brown macule darker center  Right upper flank: 1.68mm med dark brown macule   Assessment & Plan    Hx of Proliferating Pilar Tumor Left Scalp, clear, observe  Acne vulgaris Head - Anterior (Face)  improved Increase to Spironolactone 50mg  2 po qd as tolerated   Spironolactone can cause increased urination and cause blood pressure to decrease. Please watch for signs of lightheadedness and be cautious when changing position. It can sometimes cause breast tenderness or an irregular period in premenopausal women. It can also increase potassium. The increase in potassium usually is not a concern unless you are taking other medicines that also  increase potassium, so please be sure your doctor knows all of the other medications you are taking. This medication should not be taken  by pregnant women.   spironolactone (ALDACTONE) 50 MG tablet - Head - Anterior (Face)  Nevus (4) Left Knee - Anterior; Left 4th webspace, foot; Right upper flank; spinal mid back  Benign-appearing.  Observation.  Call clinic for new or changing moles.  Recommend daily use of broad spectrum spf 30+ sunscreen to sun-exposed areas.     Lentigines - Scattered tan macules - Discussed due to sun exposure - Benign, observe - Call for any changes  Seborrheic Keratoses - Stuck-on, waxy, tan-brown papules and plaques  - Discussed benign etiology and prognosis. - Observe - Call for any changes  Melanocytic Nevi - Tan-brown and/or pink-flesh-colored symmetric macules and papules - Benign appearing on exam today - Observation - Call clinic for new or changing moles - Recommend daily use of broad spectrum spf 30+ sunscreen to sun-exposed areas.   Hemangiomas - Red papules - Discussed benign nature - Observe - Call for any changes  Actinic Damage - diffuse scaly erythematous macules with underlying dyspigmentation - Recommend daily broad spectrum sunscreen SPF 30+ to sun-exposed areas, reapply every 2 hours as needed.  - Call for new or changing lesions.  Skin cancer screening performed today.   Return in about 1 year (around 05/08/2021) for TBSE.  I, Othelia Pulling, RMA, am acting as scribe for Brendolyn Patty, MD .  Documentation: I have reviewed the above documentation for accuracy and completeness, and I agree with the above.  Brendolyn Patty MD

## 2020-07-23 ENCOUNTER — Other Ambulatory Visit: Payer: Self-pay | Admitting: Obstetrics and Gynecology

## 2020-07-23 DIAGNOSIS — N6320 Unspecified lump in the left breast, unspecified quadrant: Secondary | ICD-10-CM

## 2020-08-03 ENCOUNTER — Ambulatory Visit
Admission: RE | Admit: 2020-08-03 | Discharge: 2020-08-03 | Disposition: A | Discharge status: Home / Self Care | Payer: 59 | Source: Ambulatory Visit | Attending: Obstetrics and Gynecology | Admitting: Obstetrics and Gynecology

## 2020-08-03 ENCOUNTER — Other Ambulatory Visit: Payer: Self-pay

## 2020-08-03 ENCOUNTER — Telehealth: Payer: Self-pay

## 2020-08-03 DIAGNOSIS — R921 Mammographic calcification found on diagnostic imaging of breast: Secondary | ICD-10-CM | POA: Diagnosis not present

## 2020-08-03 DIAGNOSIS — N6320 Unspecified lump in the left breast, unspecified quadrant: Secondary | ICD-10-CM

## 2020-08-03 NOTE — Telephone Encounter (Signed)
Pt called stating she thinks her IUD has shifted. Pt tried to get in contact with her GYN but they are not returning her calls. Pt asked if she could see Dr. Jonni Sanger or if Dr. Jonni Sanger could put in an order for her to get an [place]. Please advise.

## 2020-08-03 NOTE — Telephone Encounter (Signed)
See below

## 2020-08-03 NOTE — Telephone Encounter (Signed)
Needs GYN office to evaluate and order if needed. Thanks.

## 2020-08-06 DIAGNOSIS — Z30431 Encounter for routine checking of intrauterine contraceptive device: Secondary | ICD-10-CM | POA: Diagnosis not present

## 2020-08-06 NOTE — Telephone Encounter (Signed)
Called and lm for pt tcb. 

## 2020-08-07 NOTE — Telephone Encounter (Signed)
Called pt and was able to reach her, she states she saw her gyn yesterday.

## 2020-08-08 ENCOUNTER — Other Ambulatory Visit: Payer: Self-pay

## 2020-08-08 DIAGNOSIS — L700 Acne vulgaris: Secondary | ICD-10-CM

## 2020-08-08 MED ORDER — SPIRONOLACTONE 50 MG PO TABS: 100.0000 mg | ORAL_TABLET | Freq: Every day | ORAL | 5 refills | Status: DC

## 2020-08-08 MED FILL — SPIRONOLACTONE 50 MG TABLET: 50 | 30 days supply | Qty: 60 | Fill #0

## 2020-08-08 NOTE — Progress Notes (Signed)
Patient called this morning asking for that RF of her increased Spironolactone. RX sent in and patient advised.

## 2020-08-09 MED FILL — SERTRALINE HCL 50 MG TABLET: 50 | 90 days supply | Qty: 90 | Fill #1

## 2020-10-04 MED FILL — SPIRONOLACTONE 50 MG TABLET: 50 | 90 days supply | Qty: 180 | Fill #1

## 2020-10-29 MED FILL — SERTRALINE HCL 50 MG TABS: 50 | 90 days supply | Qty: 90 | Fill #2

## 2020-11-09 ENCOUNTER — Other Ambulatory Visit: Payer: Self-pay

## 2020-11-09 ENCOUNTER — Other Ambulatory Visit (HOSPITAL_COMMUNITY)
Admission: RE | Admit: 2020-11-09 | Discharge: 2020-11-09 | Disposition: A | Discharge status: Home / Self Care | Payer: 59 | Source: Ambulatory Visit | Attending: Family Medicine | Admitting: Family Medicine

## 2020-11-09 ENCOUNTER — Ambulatory Visit: Payer: 59 | Admitting: Physician Assistant

## 2020-11-09 ENCOUNTER — Encounter: Payer: Self-pay | Admitting: Physician Assistant

## 2020-11-09 VITALS — BP 110/70 | HR 61 | Temp 98.1°F | Ht 63.0 in | Wt 166.2 lb

## 2020-11-09 DIAGNOSIS — N898 Other specified noninflammatory disorders of vagina: Secondary | ICD-10-CM

## 2020-11-09 NOTE — Patient Instructions (Signed)
It was great to see you!  I will be in touch with your vaginal swab results as soon as they have returned.  Take care,  [name] PA-C

## 2020-11-09 NOTE — Progress Notes (Signed)
Sarah Lyons is a 35 y.o. female here for a new problem.  I acted as a Education administrator for [employer], PA-C Anselmo Pickler, LPN   History of Present Illness:   Chief Complaint  Patient presents with  . Vaginal Discharge    HPI   Vaginal discharge Pt c/o clear white vaginal discharge started 6 days ago. She is having odor, slight itching. Pt has not tried anything OTC. Denies: recent pelvic/vaginal pain, burning with urination, fever, chills, nausea, malaise. Endorses recent increase of sexual activity over the holidays, is monogamous with one partner.  Patient's last menstrual period was 11/06/2020. Denies concerns for pregnancy. Has IUD.   Past Medical History:  Diagnosis Date  . Anemia    hx  . Anxiety   . Iron deficiency anemia 07/30/2007     Social History   Tobacco Use  . Smoking status: Never Smoker  . Smokeless tobacco: Never Used  Substance Use Topics  . Alcohol use: Yes    Alcohol/week: 7.0 standard drinks    Types: 4 Glasses of wine, 3 Cans of beer per week  . Drug use: No    Past Surgical History:  Procedure Laterality Date  . BREAST BIOPSY Left 12/01/2019   affirm bx calcs ribbon marker, path pending  . BREAST BIOPSY Left 12/01/2019   [place] bx mass heart marker path pending  . CHOLECYSTECTOMY N/A 09/05/2015   Procedure: LAPAROSCOPIC CHOLECYSTECTOMY WITH INTRAOPERATIVE CHOLANGIOGRAM;  Surgeon: Autumn Messing III, MD;  Location: [place];  Service: General;  Laterality: N/A;  . MOUTH SURGERY     teeth     Family History  Problem Relation Age of Onset  . Breast cancer Paternal Grandmother   . Depression Mother   . Prostate cancer Father   . Hypertension Father   . Healthy Daughter   . Healthy Son   . Birth defects Maternal Grandfather        ? sarcoma  . Diabetes Neg Hx   . Hyperlipidemia Neg Hx     Allergies  Allergen Reactions  . Sulfonamide Derivatives     REACTION: RASH AS CHILD    Current Medications:   Current Outpatient Medications:  .   ALPRAZolam (XANAX) 0.5 MG tablet, Take 1 tablet (0.5 mg total) by mouth 2 (two) times daily as needed for anxiety., Disp: 60 tablet, Rfl: 0 .  levonorgestrel (MIRENA) 20 MCG/24HR IUD, 1 each by Intrauterine route once., Disp: , Rfl:  .  sertraline (ZOLOFT) 50 MG tablet, TAKE 1 TABLET AT BEDTIME (NEED TO BE SEEN FOR MORE REFILLS), Disp: 90 tablet, Rfl: 3 .  spironolactone (ALDACTONE) 50 MG tablet, Take 2 tablets (100 mg total) by mouth daily., Disp: 60 tablet, Rfl: 5   Review of Systems:   ROS  Negative unless otherwise specified per HPI.  Vitals:   Vitals:   11/09/20 1001  BP: 110/70  Pulse: 61  Temp: 98.1 F (36.7 C)  TempSrc: Temporal  SpO2: 98%  Weight: 166 lb 4 oz (75.4 kg)  Height: 5\' 3"  (1.6 m)     Body mass index is 29.45 kg/m.  Physical Exam:   Physical Exam Vitals and nursing note reviewed.  Constitutional:      General: She is not in acute distress.    Appearance: She is well-developed. She is not ill-appearing, toxic-appearing or sickly-appearing.  Cardiovascular:     Rate and Rhythm: Normal rate and regular rhythm.     Pulses: Normal pulses.     Heart sounds: Normal heart  sounds, S1 normal and S2 normal.     Comments: No LE edema Pulmonary:     Effort: Pulmonary effort is normal.     Breath sounds: Normal breath sounds.  Skin:    General: Skin is warm, dry and intact.  Neurological:     Mental Status: She is alert.     GCS: GCS eye subscore is 4. GCS verbal subscore is 5. GCS motor subscore is 6.  Psychiatric:        Mood and Affect: Mood and affect normal.        Speech: Speech normal.        Behavior: Behavior normal. Behavior is cooperative.    Patient performed self-swab.  Assessment and Plan:   Sarah Lyons was seen today for vaginal discharge.  Diagnoses and all orders for this visit:  Vaginal discharge -     Cervicovaginal ancillary only   No red flags on discussion. Patient completed self swab. Will treat based on results. Follow-up  based on clinical results/response.  [name] or LPN served as scribe during this visit. History, Physical, and Plan performed by medical provider. The above documentation has been reviewed and is accurate and complete.  Sarah Coke, PA-C

## 2020-11-12 ENCOUNTER — Other Ambulatory Visit: Payer: Self-pay | Admitting: Physician Assistant

## 2020-11-12 LAB — CERVICOVAGINAL ANCILLARY ONLY
Bacterial Vaginitis (gardnerella): POSITIVE — AB
Candida Glabrata: NEGATIVE
Candida Vaginitis: POSITIVE — AB
Chlamydia: NEGATIVE
Comment: NEGATIVE
Comment: NEGATIVE
Comment: NEGATIVE
Comment: NEGATIVE
Comment: NEGATIVE
Comment: NORMAL
Neisseria Gonorrhea: NEGATIVE
Trichomonas: NEGATIVE

## 2020-11-12 MED ORDER — FLUCONAZOLE 150 MG PO TABS: 150.0000 mg | ORAL_TABLET | Freq: Once | ORAL | 0 refills | Status: DC

## 2020-11-12 MED ORDER — METRONIDAZOLE 500 MG PO TABS: 500.0000 mg | ORAL_TABLET | Freq: Two times a day (BID) | ORAL | 0 refills | Status: DC

## 2020-11-12 MED FILL — METRONIDAZOLE 500 MG TABS: 500 | 7 days supply | Qty: 14 | Fill #0

## 2020-11-12 MED FILL — FLUCONAZOLE 150 MG TABS: 150 | 1 days supply | Qty: 1 | Fill #0

## 2021-02-16 ENCOUNTER — Other Ambulatory Visit (HOSPITAL_COMMUNITY): Payer: Self-pay

## 2021-02-16 ENCOUNTER — Other Ambulatory Visit: Payer: Self-pay | Admitting: Family Medicine

## 2021-02-16 DIAGNOSIS — F4321 Adjustment disorder with depressed mood: Secondary | ICD-10-CM

## 2021-02-18 ENCOUNTER — Other Ambulatory Visit (HOSPITAL_COMMUNITY): Payer: Self-pay

## 2021-02-22 ENCOUNTER — Other Ambulatory Visit (HOSPITAL_COMMUNITY): Payer: Self-pay

## 2021-02-22 ENCOUNTER — Other Ambulatory Visit: Payer: Self-pay

## 2021-02-22 DIAGNOSIS — F4321 Adjustment disorder with depressed mood: Secondary | ICD-10-CM

## 2021-02-22 MED ORDER — SERTRALINE HCL 50 MG PO TABS
ORAL_TABLET | ORAL | 3 refills | Status: DC
  Filled 2021-02-22: qty 90, 90d supply, fill #0
  Filled 2021-05-29: qty 90, 90d supply, fill #1
  Filled 2021-06-03: qty 90, 90d supply, fill #0
  Filled 2021-09-05: qty 90, 90d supply, fill #1
  Filled 2021-12-19: qty 90, 90d supply, fill #2

## 2021-03-22 ENCOUNTER — Other Ambulatory Visit: Payer: Self-pay | Admitting: Dermatology

## 2021-03-22 DIAGNOSIS — L700 Acne vulgaris: Secondary | ICD-10-CM

## 2021-03-25 ENCOUNTER — Other Ambulatory Visit (HOSPITAL_COMMUNITY): Payer: Self-pay

## 2021-03-25 MED ORDER — SPIRONOLACTONE 50 MG PO TABS
ORAL_TABLET | Freq: Every day | ORAL | 1 refills | Status: DC
  Filled 2021-03-25: qty 60, 30d supply, fill #0
  Filled 2021-04-25: qty 60, 30d supply, fill #1

## 2021-04-19 ENCOUNTER — Other Ambulatory Visit (HOSPITAL_COMMUNITY): Payer: Self-pay

## 2021-04-25 ENCOUNTER — Ambulatory Visit (INDEPENDENT_AMBULATORY_CARE_PROVIDER_SITE_OTHER): Payer: 59 | Admitting: Family Medicine

## 2021-04-25 ENCOUNTER — Other Ambulatory Visit: Payer: Self-pay

## 2021-04-25 ENCOUNTER — Other Ambulatory Visit (HOSPITAL_BASED_OUTPATIENT_CLINIC_OR_DEPARTMENT_OTHER): Payer: Self-pay

## 2021-04-25 ENCOUNTER — Encounter: Payer: Self-pay | Admitting: Family Medicine

## 2021-04-25 VITALS — BP 108/75 | HR 77 | Temp 98.0°F | Ht 63.0 in | Wt 158.0 lb

## 2021-04-25 DIAGNOSIS — F4321 Adjustment disorder with depressed mood: Secondary | ICD-10-CM

## 2021-04-25 DIAGNOSIS — Z23 Encounter for immunization: Secondary | ICD-10-CM

## 2021-04-25 DIAGNOSIS — L700 Acne vulgaris: Secondary | ICD-10-CM | POA: Insufficient documentation

## 2021-04-25 DIAGNOSIS — Z0000 Encounter for general adult medical examination without abnormal findings: Secondary | ICD-10-CM | POA: Diagnosis not present

## 2021-04-25 LAB — CBC WITH DIFFERENTIAL/PLATELET
Basophils Absolute: 0 10*3/uL (ref 0.0–0.1)
Basophils Relative: 0.6 % (ref 0.0–3.0)
Eosinophils Absolute: 0.1 10*3/uL (ref 0.0–0.7)
Eosinophils Relative: 0.9 % (ref 0.0–5.0)
HCT: 40.7 % (ref 36.0–46.0)
Hemoglobin: 14 g/dL (ref 12.0–15.0)
Lymphocytes Relative: 34.5 % (ref 12.0–46.0)
Lymphs Abs: 2.4 10*3/uL (ref 0.7–4.0)
MCHC: 34.5 g/dL (ref 30.0–36.0)
MCV: 88.5 fl (ref 78.0–100.0)
Monocytes Absolute: 0.7 10*3/uL (ref 0.1–1.0)
Monocytes Relative: 9.6 % (ref 3.0–12.0)
Neutro Abs: 3.8 10*3/uL (ref 1.4–7.7)
Neutrophils Relative %: 54.4 % (ref 43.0–77.0)
Platelets: 257 10*3/uL (ref 150.0–400.0)
RBC: 4.61 Mil/uL (ref 3.87–5.11)
RDW: 12.5 % (ref 11.5–15.5)
WBC: 7 10*3/uL (ref 4.0–10.5)

## 2021-04-25 LAB — COMPREHENSIVE METABOLIC PANEL
ALT: 13 U/L (ref 0–35)
AST: 18 U/L (ref 0–37)
Albumin: 4.6 g/dL (ref 3.5–5.2)
Alkaline Phosphatase: 40 U/L (ref 39–117)
BUN: 14 mg/dL (ref 6–23)
CO2: 27 mEq/L (ref 19–32)
Calcium: 9.8 mg/dL (ref 8.4–10.5)
Chloride: 103 mEq/L (ref 96–112)
Creatinine, Ser: 0.82 mg/dL (ref 0.40–1.20)
GFR: 91.63 mL/min (ref >60.00)
Glucose, Bld: 81 mg/dL (ref 70–99)
Potassium: 4.6 mEq/L (ref 3.5–5.1)
Sodium: 137 mEq/L (ref 135–145)
Total Bilirubin: 0.5 mg/dL (ref 0.2–1.2)
Total Protein: 8 g/dL (ref 6.0–8.3)

## 2021-04-25 LAB — LIPID PANEL
Cholesterol: 159 mg/dL (ref 0–200)
HDL: 67.7 mg/dL (ref >39.00)
LDL Cholesterol: 80 mg/dL (ref 0–99)
NonHDL: 91
Total CHOL/HDL Ratio: 2
Triglycerides: 54 mg/dL (ref 0.0–149.0)
VLDL: 10.8 mg/dL (ref 0.0–40.0)

## 2021-04-25 MED ORDER — COVID-19 AT HOME ANTIGEN TEST VI KIT
PACK | 0 refills | Status: DC
  Filled 2021-04-25: qty 4, 8d supply, fill #0

## 2021-04-25 NOTE — Patient Instructions (Signed)
Please return in 12 months for your annual complete physical; please come fasting.   I will release your lab results to you on your MyChart account with further instructions. Please reply with any questions.    Today you were given your Tdap vaccination.  This is good for 10 years.   If you have any questions or concerns, please don't hesitate to send me a message via MyChart or call the office at [phone]. Thank you for visiting with us today! It's our pleasure caring for you.   Please do these things to maintain good health!  Exercise at least 30-45 minutes a day,  4-5 days a week.  Eat a low-fat diet with lots of fruits and vegetables, up to 7-9 servings per day. Drink plenty of water daily. Try to drink 8 8oz glasses per day. Seatbelts can save your life. Always wear your seatbelt. Place Smoke Detectors on every level of your home and check batteries every year. Schedule an appointment with an eye doctor for an eye exam every 1-2 years Safe sex - use condoms to protect yourself from STDs if you could be exposed to these types of infections. Use birth control if you do not want to become pregnant and are sexually active. Avoid heavy alcohol use. If you drink, keep it to less than 2 drinks/day and not every day. Health Care Power of Attorney.  Choose someone you trust that could speak for you if you became unable to speak for yourself. Depression is common in our stressful world.If you're feeling down or losing interest in things you normally enjoy, please come in for a visit. If anyone is threatening or hurting you, please get help. Physical or Emotional Violence is never OK.   

## 2021-04-25 NOTE — Progress Notes (Signed)
Subjective  Chief Complaint  Patient presents with   Annual Exam    Not fasting   Depression   Anxiety    HPI: Sarah Lyons is a 35 y.o. female who presents to [facility] [place] today for a Female Wellness Visit.  She also has the concerns and/or needs as listed above in the chief complaint. These will be addressed in addition to the Health Maintenance Visit.   Wellness Visit: annual visit with health maintenance review and exam without Pap  HM: screens up to date. Sees gyn. IUD in place. Derm manages acne on spironolactone. Due for tdap. 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 Children are well.   Chronic disease management visit and/or acute problem visit: Mood: doing well on zoloft. Rare use of xanax. Had recent break up so needed xanax but is now feeling good again. No AEs  Assessment  1. Annual physical exam   2. Situational depression   3. Acne vulgaris      Plan  Female Wellness Visit: Age appropriate Health Maintenance and Prevention measures were discussed with patient. Included topics are cancer screening recommendations, ways to keep healthy (see AVS) including dietary and exercise recommendations, regular eye and dental care, use of seat belts, and avoidance of moderate alcohol use and tobacco use.  BMI: discussed patient's BMI and encouraged positive lifestyle modifications to help get to or maintain a target BMI. HM needs and immunizations were addressed and ordered. See below for orders. See HM and immunization section for updates. Tdap updated today Routine labs and screening tests ordered including cmp, cbc and lipids where appropriate. Discussed recommendations regarding Vit D and calcium supplementation (see AVS)  Chronic disease f/u and/or acute problem visit: (deemed necessary to be done in addition to the wellness visit): depression:  well controlled on zoloft 50. Will continue. Check potassium on  spironolactone  Follow up: Return in about 1 year (around 04/25/2022) for complete physical.   Orders Placed This Encounter  Procedures   Hepatitis C antibody   CBC with Differential/Platelet   Comprehensive metabolic panel   Lipid panel   No orders of the defined types were placed in this encounter.      Body mass index is 27.99 kg/m. Wt Readings from Last 3 Encounters:  04/25/21 158 lb (71.7 kg)  11/09/20 166 lb 4 oz (75.4 kg)  04/25/20 153 lb 12.8 oz (69.8 kg)     Patient Active Problem List   Diagnosis Date Noted   Acne vulgaris 04/25/2021   IUD (intrauterine device) in place 04/04/2020    Mirena #2, placed 2020 Dr. Ronita Hipps     Situational depression 01/17/2019   Situational anxiety 11/17/2018   Migraine without aura 08/30/2007   Health Maintenance  Topic Date Due   Pneumococcal Vaccine 61-31 Years old (1 - PCV) Never done   Hepatitis C Screening  Never done   TETANUS/TDAP  11/25/2020   INFLUENZA VACCINE  06/03/2021   PAP SMEAR-Modifier  11/30/2022   HIV Screening  Completed   HPV VACCINES  Aged Out   Immunization History  Administered Date(s) Administered   Influenza,inj,Quad PF,6+ Mos 07/08/2016, 08/04/2018, 08/09/2020   Influenza-Unspecified 07/14/2018, 08/01/2019   We updated and reviewed the patient's past history in detail and it is documented below. Allergies: Patient  reports current alcohol use of about 7.0 standard drinks of alcohol per week. Past Medical History Patient  has a past medical history of Anemia, Anxiety, and Iron  deficiency anemia (07/30/2007). Past Surgical History Patient  has a past surgical history that includes Mouth surgery; Cholecystectomy (N/A, 09/05/2015); Breast biopsy (Left, 12/01/2019); and Breast biopsy (Left, 12/01/2019). Social History   Socioeconomic History   Marital status: Divorced    Spouse name: Not on file   Number of children: 2   Years of education: Not on file   Highest education level: Not on file   Occupational History   Occupation: Psychologist, occupational: [employer]  Tobacco Use   Smoking status: Never   Smokeless tobacco: Never  Substance and Sexual Activity   Alcohol use: Yes    Alcohol/week: 7.0 standard drinks    Types: 4 Glasses of wine, 3 Cans of beer per week   Drug use: No   Sexual activity: Yes    Birth control/protection: I.U.D.  Other Topics Concern   Not on file  Social History Narrative   From [place] originally   Social Determinants of Health   Financial Resource Strain: Not on file  Food Insecurity: Not on file  Transportation Needs: Not on file  Physical Activity: Not on file  Stress: Not on file  Social Connections: Not on file   Family History  Problem Relation Age of Onset   Breast cancer Paternal Grandmother    Depression Mother    Prostate cancer Father    Hypertension Father    Healthy Daughter    Healthy Son    Birth defects Maternal Grandfather        ? sarcoma   Diabetes Neg Hx    Hyperlipidemia Neg Hx     Review of Systems: Constitutional: negative for fever or malaise Ophthalmic: negative for photophobia, double vision or loss of vision Cardiovascular: negative for chest pain, dyspnea on exertion, or new LE swelling Respiratory: negative for SOB or persistent cough Gastrointestinal: negative for abdominal pain, change in bowel habits or melena Genitourinary: negative for dysuria or gross hematuria, no abnormal uterine bleeding or disharge Musculoskeletal: negative for new gait disturbance or muscular weakness Integumentary: negative for new or persistent rashes, no breast lumps Neurological: negative for TIA or stroke symptoms Psychiatric: negative for SI or delusions Allergic/Immunologic: negative for hives  Patient Care Team    Relationship Specialty Notifications Start End  Leamon Arnt, MD PCP - General Family Medicine  04/04/20   Brien Few, MD Consulting Physician Obstetrics and Gynecology  05/17/18   Brendolyn Patty, MD  Dermatology  04/25/21     Objective  Vitals: BP 108/75   Pulse 77   Temp 98 F (36.7 C) (Temporal)   Ht 5\' 3"  (1.6 m)   Wt 158 lb (71.7 kg)   LMP 04/23/2021 (Exact Date)   SpO2 98%   BMI 27.99 kg/m  General:  Well developed, well nourished, no acute distress  Psych:  Alert and orientedx3,normal mood and affect HEENT:  Normocephalic, atraumatic, non-icteric sclera, PERRL, supple neck without adenopathy, mass or thyromegaly Cardiovascular:  Normal S1, S2, RRR without gallop, rub or murmur Respiratory:  Good breath sounds bilaterally, CTAB with normal respiratory effort Gastrointestinal: normal bowel sounds, soft, non-tender, no noted masses. No HSM MSK: no deformities, contusions. Joints are without erythema or swelling.  Skin:  Warm, no rashes or suspicious lesions noted Neurologic:    Mental status is normal. Gross motor and sensory exams are normal. Normal gait. No tremor    Commons side effects, risks, benefits, and alternatives for medications and treatment plan prescribed today were discussed, and the patient  expressed understanding of the given instructions. Patient is instructed to call or message via MyChart if he/she has any questions or concerns regarding our treatment plan. No barriers to understanding were identified. We discussed Red Flag symptoms and signs in detail. Patient expressed understanding regarding what to do in case of urgent or emergency type symptoms.  Medication list was reconciled, printed and provided to the patient in AVS. Patient instructions and summary information was reviewed with the patient as documented in the AVS. This note was prepared with assistance of Dragon voice recognition software. Occasional wrong-word or sound-a-like substitutions may have occurred due to the inherent limitations of voice recognition software  This visit occurred during the SARS-CoV-2 public health emergency.  Safety protocols were in place, including screening  questions prior to the visit, additional usage of staff PPE, and extensive cleaning of exam room while observing appropriate contact time as indicated for disinfecting solutions.

## 2021-04-26 LAB — HEPATITIS C ANTIBODY
Hepatitis C Ab: NONREACTIVE
SIGNAL TO CUT-OFF: 0.01 (ref <1.00)

## 2021-05-09 ENCOUNTER — Other Ambulatory Visit: Payer: Self-pay

## 2021-05-09 ENCOUNTER — Other Ambulatory Visit (HOSPITAL_BASED_OUTPATIENT_CLINIC_OR_DEPARTMENT_OTHER): Payer: Self-pay

## 2021-05-09 ENCOUNTER — Ambulatory Visit (INDEPENDENT_AMBULATORY_CARE_PROVIDER_SITE_OTHER): Payer: 59 | Admitting: Dermatology

## 2021-05-09 VITALS — BP 121/79 | HR 61

## 2021-05-09 DIAGNOSIS — Z86018 Personal history of other benign neoplasm: Secondary | ICD-10-CM

## 2021-05-09 DIAGNOSIS — L700 Acne vulgaris: Secondary | ICD-10-CM | POA: Diagnosis not present

## 2021-05-09 DIAGNOSIS — D2272 Melanocytic nevi of left lower limb, including hip: Secondary | ICD-10-CM | POA: Diagnosis not present

## 2021-05-09 DIAGNOSIS — L821 Other seborrheic keratosis: Secondary | ICD-10-CM | POA: Diagnosis not present

## 2021-05-09 DIAGNOSIS — D229 Melanocytic nevi, unspecified: Secondary | ICD-10-CM

## 2021-05-09 MED ORDER — SPIRONOLACTONE 100 MG PO TABS
100.0000 mg | ORAL_TABLET | Freq: Every day | ORAL | 11 refills | Status: DC
  Filled 2021-05-09: qty 30, 30d supply, fill #0
  Filled 2021-07-09: qty 30, 30d supply, fill #1
  Filled 2021-08-09: qty 90, 90d supply, fill #2
  Filled 2021-11-27: qty 90, 90d supply, fill #3

## 2021-05-09 MED ORDER — WINLEVI 1 % EX CREA: 1.0000 "application " | TOPICAL_CREAM | Freq: Two times a day (BID) | CUTANEOUS | 6 refills | Status: DC

## 2021-05-09 NOTE — Patient Instructions (Signed)

## 2021-05-09 NOTE — Progress Notes (Signed)
   Follow-Up Visit   Subjective  Sarah Lyons is a 35 y.o. female who presents for the following: prescription refill (Patient does have occasional flares around menses. She has tried BP in the past but it dried her skin out.).  Her skin tends to be very sensitive.  No side effects to the spironolactone 100 mg qd that she has noticed.  Recent blood work was normal.  The following portions of the chart were reviewed this encounter and updated as appropriate:       Review of Systems:  No other skin or systemic complaints except as noted in HPI or Assessment and Plan.  Objective  Well appearing patient in no apparent distress; mood and affect are within normal limits.  A focused examination was performed including the face. Relevant physical exam findings are noted in the Assessment and Plan.  Face Inflamed comedone on the L chin.  BP 121/79  Brown macules. Stable compared to previous visit.   Left Knee - Anterior 0.2 cm dark brown macule.  L 4th webspace 0.4 x 0.2 cm med light brown macule.  Spinal mid back 0.9 cm med light brown macule darker center.  R upper flank 0.15 cm med dark brown macule.   Assessment & Plan  Acne vulgaris Face  Chronic cystic acne- improved on spironolactone, but still some flares with cycle  Continue Spironolactone 100mg  po QD.  04/25/21 labs normal potassium Spironolactone can cause increased urination and cause blood pressure to decrease. Please watch for signs of lightheadedness and be cautious when changing position. It can sometimes cause breast tenderness or an irregular period in premenopausal women. It can also increase potassium. The increase in potassium usually is not a concern unless you are taking other medicines that also increase potassium, so please be sure your doctor knows all of the other medications you are taking. This medication should not be taken by pregnant women.  This medicine should also not be taken together with  sulfa drugs like Bactrim (trimethoprim/sulfamethexazole).   Start Winlevi cream BID to face prn acne flares  Clascoterone (WINLEVI) 1 % CREA - Face Apply 1 application topically 2 (two) times daily.  spironolactone (ALDACTONE) 100 MG tablet - Face Take 1 tablet (100 mg total) by mouth daily. Spironolactone can cause increased urination and cause blood pressure to decrease. Please watch for signs of lightheadedness and be cautious when changing position.  Related Medications spironolactone (ALDACTONE) 50 MG tablet TAKE 2 TABLETS BY MOUTH ONCE A DAY  Nevus (4) Left Knee - Anterior; L 4th webspace; R upper flank; Spinal mid back  Benign-appearing.  Observation.  Call clinic for new or changing moles.  Recommend daily use of broad spectrum spf 30+ sunscreen to sun-exposed areas.   Stable compared to previous photos   Seborrheic Keratoses - L inguinal x 2 - Stuck-on, waxy, tan-brown papules and/or plaques  - Benign-appearing - Discussed benign etiology and prognosis. - Observe - Call for any changes  HX of proliferating pilar tumor - L scalp, clear, observe  Return in about 1 year (around 05/09/2022) for acne follow up .  Luther Redo, [name], am acting as scribe for Brendolyn Patty, MD .  Documentation: I have reviewed the above documentation for accuracy and completeness, and I agree with the above.  Brendolyn Patty MD

## 2021-05-10 ENCOUNTER — Other Ambulatory Visit (HOSPITAL_BASED_OUTPATIENT_CLINIC_OR_DEPARTMENT_OTHER): Payer: Self-pay

## 2021-05-21 ENCOUNTER — Encounter: Payer: 59 | Admitting: Dermatology

## 2021-05-30 ENCOUNTER — Other Ambulatory Visit (HOSPITAL_COMMUNITY): Payer: Self-pay

## 2021-06-03 ENCOUNTER — Other Ambulatory Visit (HOSPITAL_BASED_OUTPATIENT_CLINIC_OR_DEPARTMENT_OTHER): Payer: Self-pay

## 2021-06-10 ENCOUNTER — Other Ambulatory Visit (HOSPITAL_COMMUNITY): Payer: Self-pay

## 2021-07-02 ENCOUNTER — Encounter: Payer: 59 | Admitting: Dermatology

## 2021-07-09 ENCOUNTER — Other Ambulatory Visit (HOSPITAL_BASED_OUTPATIENT_CLINIC_OR_DEPARTMENT_OTHER): Payer: Self-pay

## 2021-07-09 MED ORDER — COVID-19 AT HOME ANTIGEN TEST VI KIT
PACK | 0 refills | Status: DC
  Filled 2021-07-09: qty 2, 4d supply, fill #0

## 2021-08-09 ENCOUNTER — Other Ambulatory Visit: Payer: Self-pay | Admitting: Dermatology

## 2021-08-09 ENCOUNTER — Other Ambulatory Visit (HOSPITAL_BASED_OUTPATIENT_CLINIC_OR_DEPARTMENT_OTHER): Payer: Self-pay

## 2021-08-09 ENCOUNTER — Other Ambulatory Visit: Payer: Self-pay

## 2021-08-09 ENCOUNTER — Ambulatory Visit: Payer: 59 | Attending: Internal Medicine

## 2021-08-09 DIAGNOSIS — Z23 Encounter for immunization: Secondary | ICD-10-CM

## 2021-08-09 DIAGNOSIS — L700 Acne vulgaris: Secondary | ICD-10-CM

## 2021-08-09 MED ORDER — PFIZER COVID-19 VAC BIVALENT 30 MCG/0.3ML IM SUSP
INTRAMUSCULAR | 0 refills | Status: DC
  Filled 2021-08-09: qty 0.3, 1d supply, fill #0

## 2021-08-09 NOTE — Progress Notes (Signed)
   Covid-19 Vaccination Clinic  Name:  Arturo Freundlich    MRN: [id number] DOB: May 28, 1986  08/09/2021  Ms. Meske was observed post Covid-19 immunization for 15 minutes without incident. She was provided with Vaccine Information Sheet and instruction to access the V-Safe system.   Ms. Duchesneau was instructed to call 911 with any severe reactions post vaccine: Difficulty breathing  Swelling of face and throat  A fast heartbeat  A bad rash all over body  Dizziness and weakness

## 2021-08-12 ENCOUNTER — Other Ambulatory Visit (HOSPITAL_BASED_OUTPATIENT_CLINIC_OR_DEPARTMENT_OTHER): Payer: Self-pay

## 2021-08-12 MED ORDER — SPIRONOLACTONE 50 MG PO TABS
ORAL_TABLET | Freq: Every day | ORAL | 4 refills | Status: DC
  Filled 2021-08-12: qty 180, 90d supply, fill #0

## 2021-08-26 ENCOUNTER — Other Ambulatory Visit (HOSPITAL_BASED_OUTPATIENT_CLINIC_OR_DEPARTMENT_OTHER): Payer: Self-pay

## 2021-09-05 ENCOUNTER — Other Ambulatory Visit (HOSPITAL_BASED_OUTPATIENT_CLINIC_OR_DEPARTMENT_OTHER): Payer: Self-pay

## 2021-09-05 MED ORDER — COVID-19 AT HOME ANTIGEN TEST VI KIT
PACK | 0 refills | Status: DC
  Filled 2021-09-05: qty 2, 4d supply, fill #0

## 2021-10-08 ENCOUNTER — Ambulatory Visit: Payer: 59 | Admitting: Family Medicine

## 2021-10-08 ENCOUNTER — Other Ambulatory Visit: Payer: Self-pay

## 2021-10-08 ENCOUNTER — Encounter: Payer: Self-pay | Admitting: Family Medicine

## 2021-10-08 VITALS — BP 110/70 | HR 64 | Temp 97.2°F | Ht 63.0 in | Wt 163.0 lb

## 2021-10-08 DIAGNOSIS — R059 Cough, unspecified: Secondary | ICD-10-CM

## 2021-10-08 DIAGNOSIS — J029 Acute pharyngitis, unspecified: Secondary | ICD-10-CM | POA: Diagnosis not present

## 2021-10-08 LAB — POCT INFLUENZA A/B
Influenza A, POC: NEGATIVE
Influenza B, POC: NEGATIVE

## 2021-10-08 LAB — POC COVID19 BINAXNOW: SARS Coronavirus 2 Ag: NEGATIVE

## 2021-10-08 LAB — POCT RAPID STREP A (OFFICE): Rapid Strep A Screen: NEGATIVE

## 2021-10-08 LAB — POCT MONO (EPSTEIN BARR VIRUS): Mono, POC: NEGATIVE

## 2021-10-08 NOTE — Addendum Note (Signed)
Addended by: Loura Back on: 10/08/2021 12:26 PM   Modules accepted: Orders

## 2021-10-08 NOTE — Patient Instructions (Signed)
Please follow up if symptoms do not improve or as needed.    Your strep culture and monotest will be back in 1-2 days.   Pharyngitis Pharyngitis is inflammation of the throat (pharynx). It is a very common cause of sore throat. Pharyngitis can be caused by a bacteria, but it is usually caused by a virus. Most cases of pharyngitis get better on their own without treatment. What are the causes? This condition may be caused by: Infection by viruses (viral). Viral pharyngitis spreads easily from person to person (is contagious) through coughing, sneezing, and sharing of personal items or utensils such as cups, forks, spoons, and toothbrushes. Infection by bacteria (bacterial). Bacterial pharyngitis may be spread by touching the nose or face after coming in contact with the bacteria, or through close contact, such as kissing. Allergies. Allergies can cause buildup of mucus in the throat (post-nasal drip), leading to inflammation and irritation. Allergies can also cause blocked nasal passages, forcing breathing through the mouth, which dries and irritates the throat. What increases the risk? You are more likely to develop this condition if: You are 9-63 years old. You are exposed to crowded environments such as daycare, school, or dormitory living. You live in a cold climate. You have a weakened disease-fighting (immune) system. What are the signs or symptoms? Symptoms of this condition vary by the cause. Common symptoms of this condition include: Sore throat. Fatigue. Low-grade fever. Stuffy nose (nasal congestion) and cough. Headache. Other symptoms may include: Glands in the neck (lymph nodes) that are swollen. Skin rashes. Plaque-like film on the throat or tonsils. This is often a symptom of bacterial pharyngitis. Vomiting. Red, itchy eyes (conjunctivitis). Loss of appetite. Joint pain and muscle aches. Enlarged tonsils. How is this diagnosed? This condition may be diagnosed based on  your medical history and a physical exam. Your health care provider will ask you questions about your illness and your symptoms. A swab of your throat may be done to check for bacteria (rapid strep test). Other lab tests may also be done, depending on the suspected cause, but these are rare. How is this treated? Many times, treatment is not needed for this condition. Pharyngitis usually gets better in 3-4 days without treatment. Bacterial pharyngitis may be treated with antibiotic medicines. Follow these instructions at home: Medicines Take over-the-counter and prescription medicines only as told by your health care provider. If you were prescribed an antibiotic medicine, take it as told by your health care provider. Do not stop taking the antibiotic even if you start to feel better. Use throat sprays to soothe your throat as told by your health care provider. Children can get pharyngitis. Do not give your child aspirin because of the association with Reye's syndrome. Managing pain To help with pain, try: Sipping warm liquids, such as broth, herbal tea, or warm water. Eating or drinking cold or frozen liquids, such as frozen ice pops. Gargling with a mixture of salt and water 3-4 times a day or as needed. To make salt water, completely dissolve -1 tsp (3-6 g) of salt in 1 cup (237 mL) of warm water. Sucking on hard candy or throat lozenges. Putting a cool-mist humidifier in your bedroom at night to moisten the air. Sitting in the bathroom with the door closed for 5-10 minutes while you run hot water in the shower.  General instructions  Do not use any products that contain nicotine or tobacco. These products include cigarettes, chewing tobacco, and vaping devices, such as e-cigarettes. If  you need help quitting, ask your health care provider. Rest as told by your health care provider. Drink enough fluid to keep your urine pale yellow. How is this prevented? To help prevent becoming  infected or spreading infection: Wash your hands often with soap and water for at least 20 seconds. If soap and water are not available, use hand sanitizer. Do not touch your eyes, nose, or mouth with unwashed hands, and wash hands after touching these areas. Do not share cups or eating utensils. Avoid close contact with people who are sick. Contact a health care provider if: You have large, tender lumps in your neck. You have a rash. You cough up green, yellow-brown, or bloody mucus. Get help right away if: Your neck becomes stiff. You drool or are unable to swallow liquids. You cannot drink or take medicines without vomiting. You have severe pain that does not go away, even after you take medicine. You have trouble breathing, and it is not caused by a stuffy nose. You have new pain and swelling in your joints such as the knees, ankles, wrists, or elbows. These symptoms may represent a serious problem that is an emergency. Do not wait to see if the symptoms will go away. Get medical help right away. Call your local emergency services (911 in the [place] Do not drive yourself to the hospital. Summary Pharyngitis is redness, pain, and swelling (inflammation) of the throat (pharynx). While pharyngitis can be caused by a bacteria, the most common causes are viral. Most cases of pharyngitis get better on their own without treatment. Bacterial pharyngitis is treated with antibiotic medicines. This information is not intended to replace advice given to you by your health care provider. Make sure you discuss any questions you have with your health care provider. Document Revised: 01/16/2021 Document Reviewed: 01/16/2021 Elsevier Patient Education  [place].

## 2021-10-08 NOTE — Addendum Note (Signed)
Addended by: Loura Back on: 10/08/2021 12:27 PM   Modules accepted: Orders

## 2021-10-08 NOTE — Progress Notes (Signed)
Subjective  CC:  Chief Complaint  Patient presents with   Sore Throat    Negative covid test on Friday 12/2, symptoms started 12/2.    Cough    Dry    Headache    Body aches    HPI: Sarah Lyons is a 35 y.o. female who presents to the office today to address the problems listed above in the chief complaint. C/o sore throat, mild URI sxs without fever. = myalgias and malaise x 4-5 days. No SOB or GI sxs. No known exposure ot strep or mono. OTC analgesics have been used with minimal or mild relief. Eating and drinking OK.  Neg covid home test. Vaccinated against covid and flu.  Worries cuz she has had several mild URIs over the last several mnths:mild st and lasted 2 days. No bacterial infections.   I reviewed the patients updated PMH, FH, and SocHx.    Patient Active Problem List   Diagnosis Date Noted   Acne vulgaris 04/25/2021   IUD (intrauterine device) in place 04/04/2020   Situational depression 01/17/2019   Situational anxiety 11/17/2018   Migraine without aura 08/30/2007   Current Meds  Medication Sig   ALPRAZolam (XANAX) 0.5 MG tablet Take 1 tablet (0.5 mg total) by mouth 2 (two) times daily as needed for anxiety.   COVID-19 At Home Antigen Test KIT Use as directed   COVID-19 mRNA bivalent vaccine, Pfizer, (PFIZER COVID-19 VAC BIVALENT) injection Inject into the muscle.   levonorgestrel (MIRENA) 20 MCG/24HR IUD 1 each by Intrauterine route once.   sertraline (ZOLOFT) 50 MG tablet TAKE 1 TABLET BY MOUTH AT BEDTIME.   spironolactone (ALDACTONE) 100 MG tablet Take 1 tablet (100 mg total) by mouth daily. Spironolactone can cause increased urination and cause blood pressure to decrease. Please watch for signs of lightheadedness and be cautious when changing position.    Allergies: Patient is allergic to sulfonamide derivatives.  Review of Systems: Constitutional: Negative for fever malaise or anorexia Cardiovascular: negative for chest pain Respiratory: negative  for SOB or persistent cough Gastrointestinal: negative for abdominal pain  Objective  Vitals: BP 110/70   Pulse 64   Temp (!) 97.2 F (36.2 C) (Temporal)   Ht _0  (1.6 m)   Wt 163 lb (73.9 kg)   LMP 10/05/2021 (Exact Date)   SpO2 100%   BMI 28.87 kg/m  General: no acute distress , A&Ox3 HEENT: PEERL, conjunctiva normal, bilateral EAC and TMs are normal. Nares normal. Oropharynx moist with erythematous posterior pharynx with exudate, + cervical LAD, no posterior lymphadenopathy, 2+ tonsils, midline uvula, neck is supple Cardiovascular:  RRR without murmur or gallop.  Respiratory:  Good breath sounds bilaterally, CTAB with normal respiratory effort Skin:  Warm, no rashes  Office Visit on 10/08/2021  Component Date Value Ref Range Status   SARS Coronavirus 2 Ag 10/08/2021 Negative  Negative Final   Influenza A, POC 10/08/2021 Negative  Negative Final   Influenza B, POC 10/08/2021 Negative  Negative Final   Rapid Strep A Screen 10/08/2021 Negative  Negative Final    Assessment  1. Pharyngitis, unspecified etiology   2. Cough, unspecified type      Plan  Suspect viral. Possible mono. Await results. Supportive care with advil, tylenol, gargles etc discussed. Reassured.. RTO if sxs persist or worsen.  I spent a total of 42 minutes for this patient encounter. Time spent included preparation, face-to-face counseling with the patient and coordination of care, review of chart and records, and documentation  of the encounter.  Follow up: No follow-ups on file.   Commons side effects, risks, benefits, and alternatives for medications and treatment plan prescribed today were discussed, and the patient expressed understanding of the given instructions. Patient is instructed to call or message via MyChart if he/she has any questions or concerns regarding our treatment plan. No barriers to understanding were identified. We discussed Red Flag symptoms and signs in detail. Patient expressed  understanding regarding what to do in case of urgent or emergency type symptoms.  Medication list was reconciled, printed and provided to the patient in AVS. Patient instructions and summary information was reviewed with the patient as documented in the AVS. This note was prepared with assistance of Dragon voice recognition software. Occasional wrong-word or sound-a-like substitutions may have occurred due to the inherent limitations of voice recognition software  Orders Placed This Encounter  Procedures   Culture, Group A Strep   Monospot   POC COVID-19   POCT Influenza A/B   POCT rapid strep A   No orders of the defined types were placed in this encounter.

## 2021-10-10 LAB — CULTURE, GROUP A STREP
MICRO NUMBER:: 12720118
SPECIMEN QUALITY:: ADEQUATE

## 2021-10-11 ENCOUNTER — Encounter: Payer: Self-pay | Admitting: Family Medicine

## 2021-10-11 ENCOUNTER — Other Ambulatory Visit (HOSPITAL_COMMUNITY): Payer: Self-pay

## 2021-10-11 MED ORDER — PREDNISONE 20 MG PO TABS
40.0000 mg | ORAL_TABLET | Freq: Every day | ORAL | 0 refills | Status: AC
  Filled 2021-10-11: qty 10, 5d supply, fill #0

## 2021-10-15 ENCOUNTER — Other Ambulatory Visit (HOSPITAL_BASED_OUTPATIENT_CLINIC_OR_DEPARTMENT_OTHER): Payer: Self-pay

## 2021-10-15 ENCOUNTER — Other Ambulatory Visit (HOSPITAL_COMMUNITY): Payer: Self-pay

## 2021-10-15 MED ORDER — ERYTHROMYCIN 5 MG/GM OP OINT
1.0000 "application " | TOPICAL_OINTMENT | Freq: Three times a day (TID) | OPHTHALMIC | 0 refills | Status: DC
  Filled 2021-10-15: qty 3.5, 5d supply, fill #0
  Filled 2021-10-15: qty 3.5, 1d supply, fill #0

## 2021-10-15 NOTE — Addendum Note (Signed)
Addended by: Billey Chang on: 10/15/2021 12:21 PM   Modules accepted: Orders

## 2021-10-18 ENCOUNTER — Other Ambulatory Visit: Payer: Self-pay

## 2021-10-18 ENCOUNTER — Other Ambulatory Visit (HOSPITAL_COMMUNITY): Payer: Self-pay

## 2021-10-18 ENCOUNTER — Other Ambulatory Visit (HOSPITAL_BASED_OUTPATIENT_CLINIC_OR_DEPARTMENT_OTHER): Payer: Self-pay

## 2021-10-18 MED ORDER — AZITHROMYCIN 250 MG PO TABS
ORAL_TABLET | ORAL | 0 refills | Status: DC
  Filled 2021-10-18: qty 6, 5d supply, fill #0

## 2021-10-18 NOTE — Addendum Note (Signed)
Addended by: Billey Chang on: 10/18/2021 12:07 PM   Modules accepted: Orders

## 2021-10-18 NOTE — Telephone Encounter (Signed)
Will treat for bacterial bronchitis due to prolonged and recurrent course. Zpak ordered

## 2021-11-06 ENCOUNTER — Other Ambulatory Visit (HOSPITAL_COMMUNITY): Payer: Self-pay

## 2021-11-27 ENCOUNTER — Other Ambulatory Visit (HOSPITAL_BASED_OUTPATIENT_CLINIC_OR_DEPARTMENT_OTHER): Payer: Self-pay

## 2021-12-17 IMAGING — MG DIGITAL DIAGNOSTIC BILAT W/ TOMO W/ CAD
8 of 15 series · 8 of 39 positions shown · non-contrast
Comparison: Baseline exam

CLINICAL DATA: Patient has felt a mass in the LEFT breast for 2
weeks. Mass may be getting smaller.

EXAM:
DIGITAL DIAGNOSTIC BILATERAL MAMMOGRAM WITH CAD AND TOMO
ULTRASOUND LEFT BREAST

[L CC]
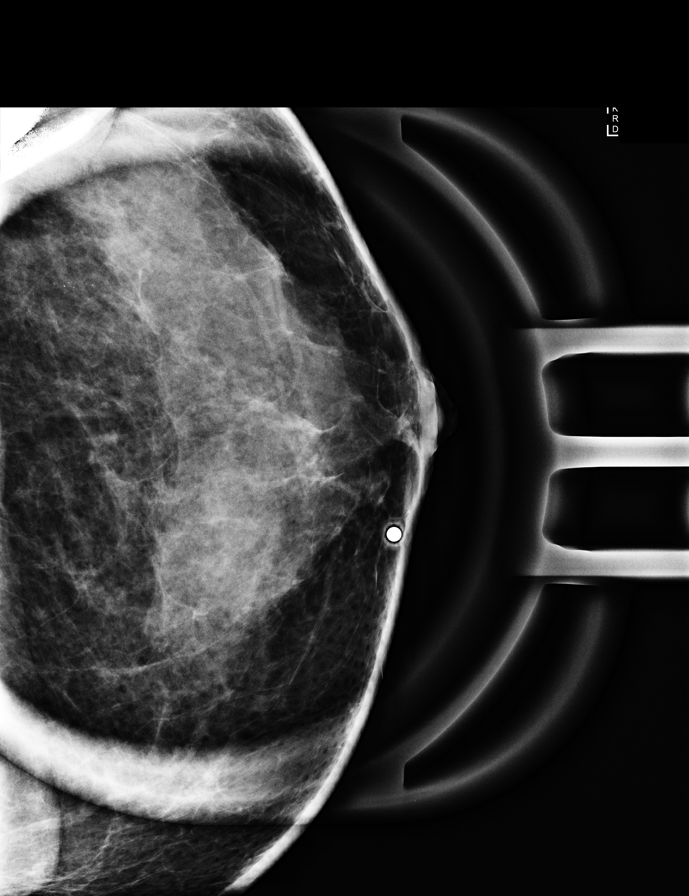

[L ML (1 of 2)]
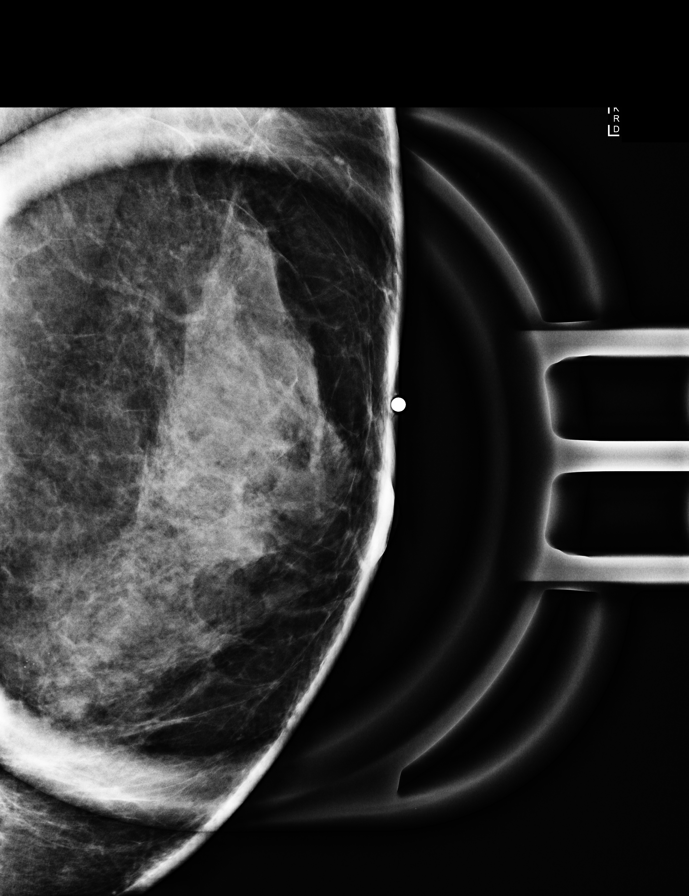

[L ML (2 of 2)]
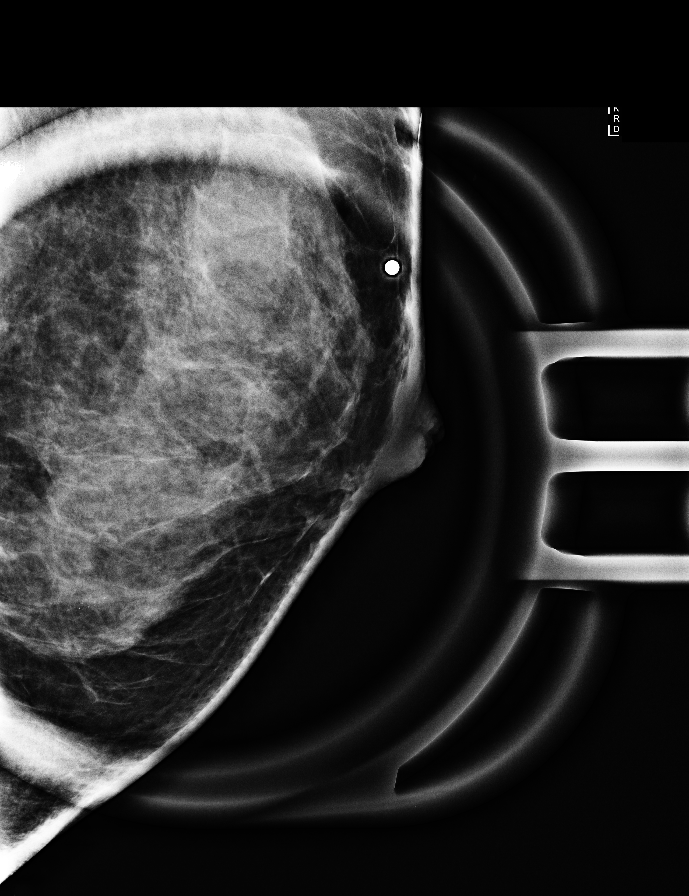

[R CC synth-2D]
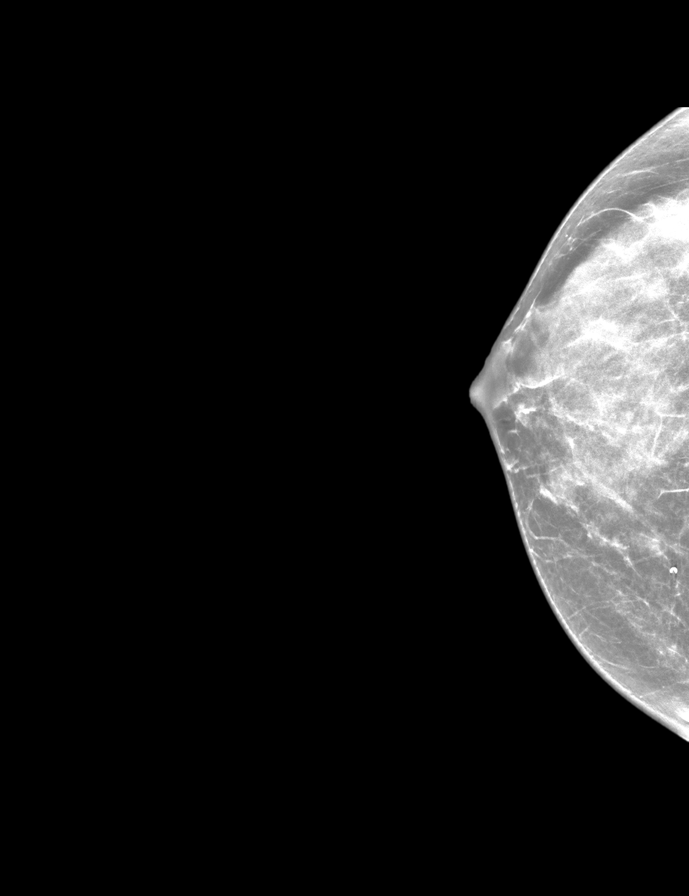

[L CC synth-2D]
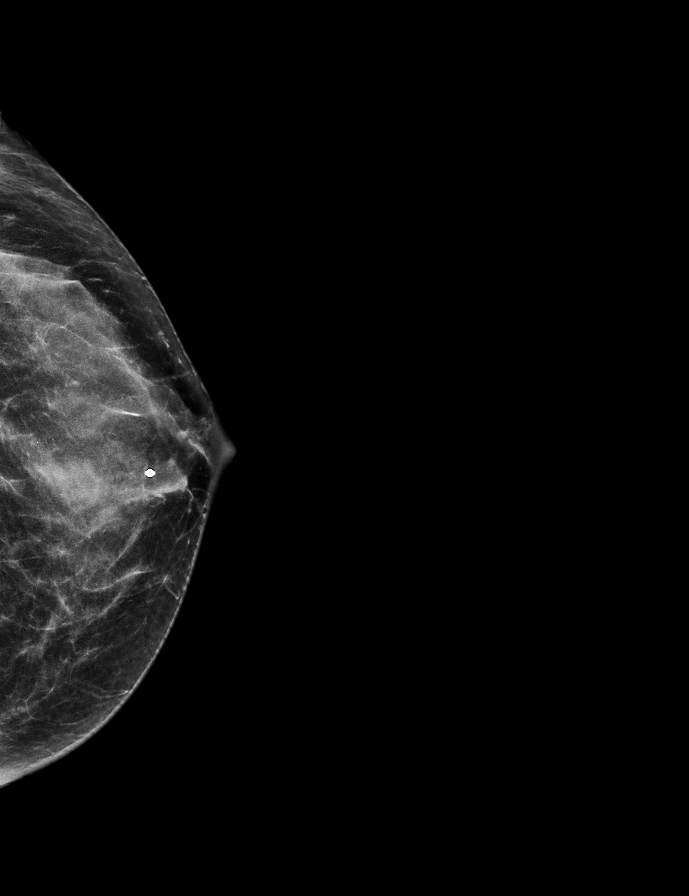

[L ML synth-2D]
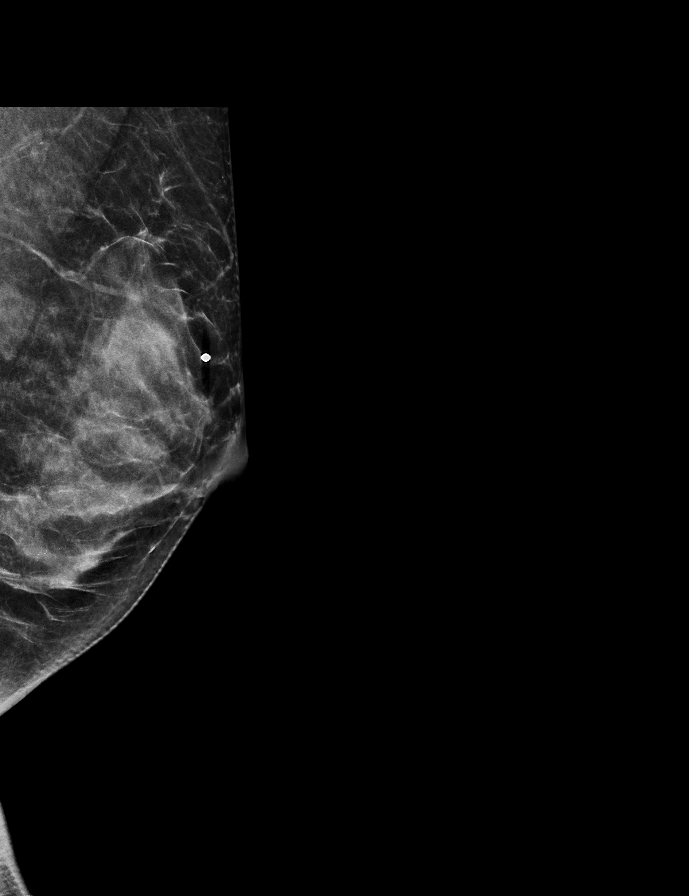

[R MLO synth-2D]
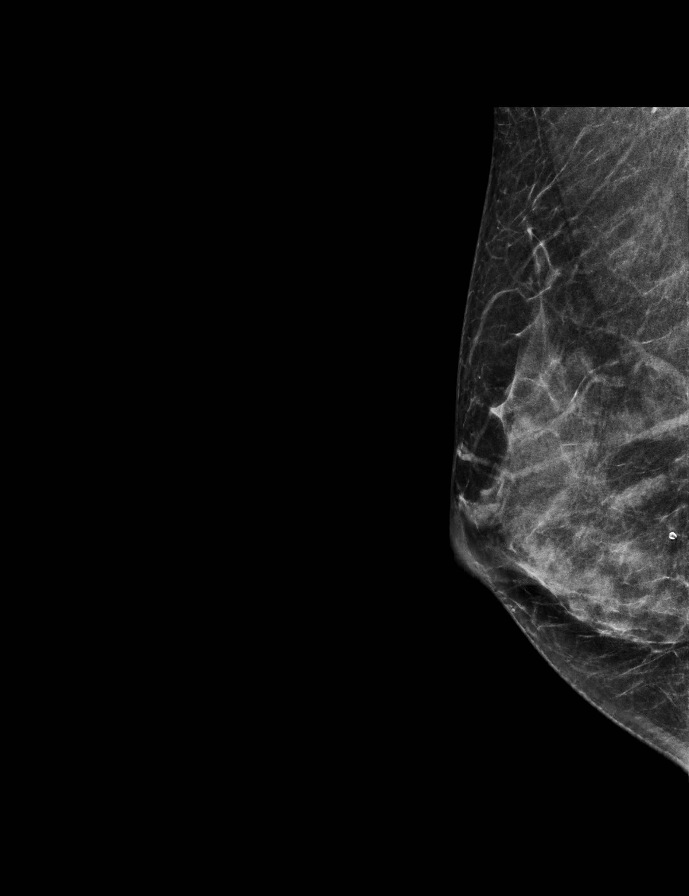

[L MLO synth-2D]
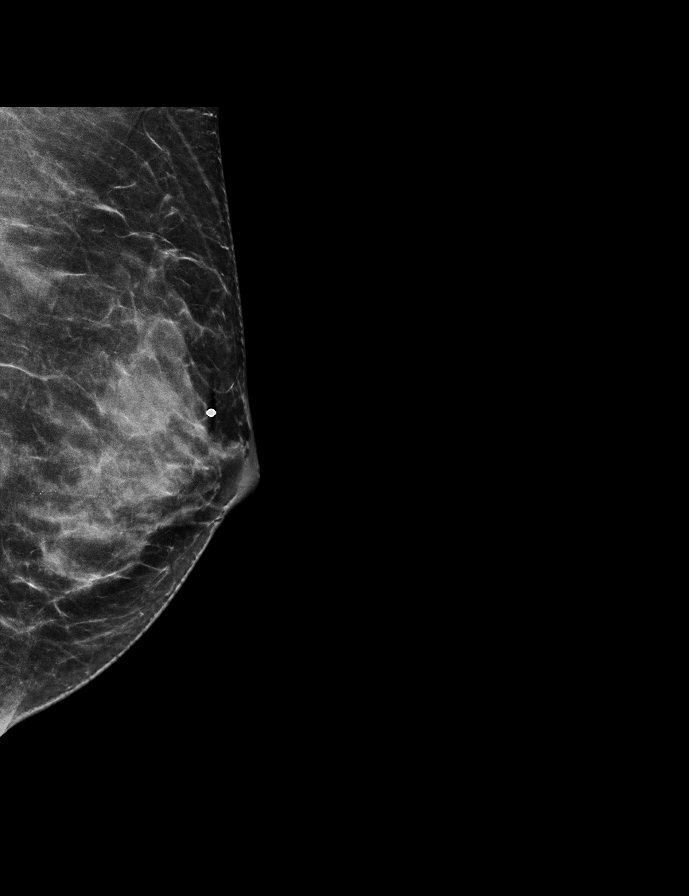

[8 of 39 positions shown; findings below may reference images not displayed]

ACR Breast Density Category c: The breast tissue is heterogeneously
dense, which may obscure small masses.
FINDINGS: Spot tangential view in the area concern in the LEFT breast shows
dense fibroglandular tissue. Magnified views are performed of
calcifications in the LEFT breast. On magnified views there is a 5
millimeter group of punctate and rounded calcifications within the
LOWER OUTER QUADRANT.

Mammographic images were processed with CAD.

On physical exam, I palpate a discrete mobile mass in the 11 o'clock
location of the LEFT breast.

Targeted ultrasound is performed, showing a cyst with slightly
thickened wall in the 11 o'clock location LEFT breast 2 centimeters
from the nipple. The dependent aspect of this cyst is somewhat
irregular, but there is no associated internal blood flow.
Additional cysts are also identified in immediate retroareolar
region.
IMPRESSION: 1. Indeterminate 5 millimeter group of calcifications in the LOWER
OUTER QUADRANT LEFT breast. Tissue diagnosis is recommended.
2. Cyst with mildly thickened wall in the 11:30 o'clock location of
the LEFT breast correspond to the palpable abnormality. Findings
likely represent inflammatory changes associated with benign cyst.
However, recommend cyst aspiration and/or biopsy of this lesion at
the time of stereotactic biopsy.

RECOMMENDATION:
1. Stereotactic biopsy of calcifications in the LEFT breast.
2. Aspiration versus biopsy of cystic lesion in the 11 o'clock
location LEFT breast.

I have discussed the findings and recommendations with the patient.
If applicable, a reminder letter will be sent to the patient
regarding the next appointment.

BI-RADS CATEGORY  4: Suspicious.

## 2021-12-19 ENCOUNTER — Other Ambulatory Visit (HOSPITAL_BASED_OUTPATIENT_CLINIC_OR_DEPARTMENT_OTHER): Payer: Self-pay

## 2021-12-20 ENCOUNTER — Other Ambulatory Visit (HOSPITAL_BASED_OUTPATIENT_CLINIC_OR_DEPARTMENT_OTHER): Payer: Self-pay

## 2021-12-20 MED ORDER — COVID-19 AT HOME ANTIGEN TEST VI KIT
PACK | 0 refills | Status: DC
  Filled 2021-12-20: qty 2, 4d supply, fill #0

## 2021-12-23 ENCOUNTER — Other Ambulatory Visit (HOSPITAL_BASED_OUTPATIENT_CLINIC_OR_DEPARTMENT_OTHER): Payer: Self-pay

## 2022-01-07 ENCOUNTER — Other Ambulatory Visit (HOSPITAL_BASED_OUTPATIENT_CLINIC_OR_DEPARTMENT_OTHER): Payer: Self-pay

## 2022-01-07 ENCOUNTER — Ambulatory Visit: Payer: 59 | Admitting: Family Medicine

## 2022-01-07 ENCOUNTER — Encounter: Payer: Self-pay | Admitting: Family Medicine

## 2022-01-07 VITALS — BP 112/74 | HR 86 | Temp 98.7°F | Ht 63.0 in | Wt 164.5 lb

## 2022-01-07 DIAGNOSIS — J029 Acute pharyngitis, unspecified: Secondary | ICD-10-CM | POA: Diagnosis not present

## 2022-01-07 LAB — POCT MONO (EPSTEIN BARR VIRUS): Mono, POC: NEGATIVE

## 2022-01-07 LAB — POCT RAPID STREP A (OFFICE): Rapid Strep A Screen: NEGATIVE

## 2022-01-07 MED ORDER — PENICILLIN V POTASSIUM 500 MG PO TABS
500.0000 mg | ORAL_TABLET | Freq: Three times a day (TID) | ORAL | 0 refills | Status: AC
  Filled 2022-01-07: qty 30, 10d supply, fill #0

## 2022-01-07 NOTE — Progress Notes (Signed)
? ?Subjective:  ? ? ? Patient ID: Sarah Lyons, female    DOB: Aug 23, 1986, 36 y.o.   MRN: [id number] ? ?Chief Complaint  ?Patient presents with  ? Sore Throat  ?  Symptoms started on yesterday, worse today ?Has been taking Nyquil and Tylenol Cold and Flu ?  ? Chills  ? Fever  ? Generalized Body Aches  ? ? ?HPI ?Chief complaint: sore throat ?Symptom onset: yesterday ?Pertinent positives: feeling feverish, chills, sweating, myalgias, bad sore throat.  Tyllenol at 230 ?Pertinent negatives: no v/d ?Treatments tried: OTC ?Vaccine status: UTD ?Sick exposure: no one  ? ?Health Maintenance Due  ?Topic Date Due  ? COVID-19 Vaccine (2 - Pfizer risk series) 08/30/2021  ? ? ?Past Medical History:  ?Diagnosis Date  ? Anemia   ? hx  ? Anxiety   ? Iron deficiency anemia 07/30/2007  ? ? ?Past Surgical History:  ?Procedure Laterality Date  ? BREAST BIOPSY Left 12/01/2019  ? affirm bx calcs ribbon marker, path pending  ? BREAST BIOPSY Left 12/01/2019  ? [place] bx mass heart marker path pending  ? CHOLECYSTECTOMY N/A 09/05/2015  ? Procedure: LAPAROSCOPIC CHOLECYSTECTOMY WITH INTRAOPERATIVE CHOLANGIOGRAM;  Surgeon: Autumn Messing III, MD;  Location: [place];  Service: General;  Laterality: N/A;  ? MOUTH SURGERY    ? teeth   ? ? ?Outpatient Medications Prior to Visit  ?Medication Sig Dispense Refill  ? ALPRAZolam (XANAX) 0.5 MG tablet Take 1 tablet (0.5 mg total) by mouth 2 (two) times daily as needed for anxiety. 60 tablet 0  ? levonorgestrel (MIRENA) 20 MCG/24HR IUD 1 each by Intrauterine route once.    ? sertraline (ZOLOFT) 50 MG tablet TAKE 1 TABLET BY MOUTH AT BEDTIME. 90 tablet 3  ? spironolactone (ALDACTONE) 100 MG tablet Take 1 tablet (100 mg total) by mouth daily. Spironolactone can cause increased urination and cause blood pressure to decrease. Please watch for signs of lightheadedness and be cautious when changing position. 30 tablet 11  ? COVID-19 mRNA bivalent vaccine, Pfizer, (PFIZER COVID-19 VAC BIVALENT) injection Inject into  the muscle. (Patient not taking: Reported on 01/07/2022) 0.3 mL 0  ? azithromycin (ZITHROMAX) 250 MG tablet Take 2 tablets by mouth today, then take 1 tablet daily for 4 days (Patient not taking: Reported on 01/07/2022) 6 tablet 0  ? Clascoterone (WINLEVI) 1 % CREA Apply 1 application topically 2 (two) times daily. (Patient not taking: Reported on 10/08/2021) 60 g 6  ? COVID-19 At Home Antigen Test KIT Use as directed (Patient not taking: Reported on 01/07/2022) 2 kit 0  ? COVID-19 At Home Antigen Test KIT Use as directed. (Patient not taking: Reported on 01/07/2022) 2 kit 0  ? erythromycin ophthalmic ointment Place into the left eye 3 (three) times daily. (Patient not taking: Reported on 01/07/2022) 3.5 g 0  ? spironolactone (ALDACTONE) 50 MG tablet TAKE 2 TABLETS BY MOUTH ONCE A DAY (Patient not taking: Reported on 10/08/2021) 60 tablet 4  ? ?No facility-administered medications prior to visit.  ? ? ?Allergies  ?Allergen Reactions  ? Sulfonamide Derivatives   ?  REACTION: RASH AS CHILD  ? ?ROS neg/noncontributory except as noted HPI/below ? ? ?   ?Objective:  ?  ? ?BP 112/74   Pulse 86   Temp 98.7 ?F (37.1 ?C) (Temporal)   Ht _0  (1.6 m)   Wt 164 lb 8 oz (74.6 kg)   LMP 12/24/2021 (Exact Date)   SpO2 98%   BMI 29.14 kg/m?  ?Wt Readings  from Last 3 Encounters:  ?01/07/22 164 lb 8 oz (74.6 kg)  ?10/08/21 163 lb (73.9 kg)  ?04/25/21 158 lb (71.7 kg)  ? ? ?Physical Exam  ? ?Gen: WDWN NAD WF ?HEENT: NCAT, conjunctiva not injected, sclera nonicteric ?TM WNL B, OP moist, red, swollen-+exudates/ulcers ?NECK:  supple, no thyromegaly,+ tender submand nodes, no carotid bruits ?CARDIAC: RRR, S1S2+, no murmur. DP 2+B ?LUNGS: CTAB. No wheezes ?ABDOMEN:  BS+, soft, NTND, No HSM, no masses ?EXT:  no edema ?MSK: no gross abnormalities.  ?NEURO: A&O x3.  CN II-XII intact.  ?PSYCH: normal mood. Good eye contact ? ?Results for orders placed or performed in visit on 01/07/22  ?POCT Mono (Epstein Barr Virus)  ?Result Value Ref Range  ?  Mono, POC Negative Negative  ?POCT rapid strep A  ?Result Value Ref Range  ? Rapid Strep A Screen Negative Negative  ?  ? ?   ?Assessment & Plan:  ? ?Problem List Items Addressed This Visit   ?None ?Visit Diagnoses   ? ? Pharyngitis, unspecified etiology    -  Primary  ? Relevant Orders  ? POCT Mono (Epstein Barr Virus)  ? POCT rapid strep A (Completed)  ? ?  ?1,.  Pharyngitis-tylenol/motrin/garggles. Will tx w/pcn.  Strep a and mono neg but throat bad ? ?No orders of the defined types were placed in this encounter. ? ? ?Wellington Hampshire, MD ? ?

## 2022-01-07 NOTE — Patient Instructions (Signed)
Meds have been sent the the pharmacy °You can take tylenol for pain/fevers °If worsening symptoms, let us know or go to the Emergency room  ° ° °

## 2022-01-15 DIAGNOSIS — L814 Other melanin hyperpigmentation: Secondary | ICD-10-CM | POA: Diagnosis not present

## 2022-01-15 DIAGNOSIS — L578 Other skin changes due to chronic exposure to nonionizing radiation: Secondary | ICD-10-CM | POA: Diagnosis not present

## 2022-01-15 DIAGNOSIS — Z23 Encounter for immunization: Secondary | ICD-10-CM | POA: Diagnosis not present

## 2022-01-15 DIAGNOSIS — L821 Other seborrheic keratosis: Secondary | ICD-10-CM | POA: Diagnosis not present

## 2022-01-15 DIAGNOSIS — D229 Melanocytic nevi, unspecified: Secondary | ICD-10-CM | POA: Diagnosis not present

## 2022-01-15 DIAGNOSIS — D1801 Hemangioma of skin and subcutaneous tissue: Secondary | ICD-10-CM | POA: Diagnosis not present

## 2022-01-15 DIAGNOSIS — L700 Acne vulgaris: Secondary | ICD-10-CM | POA: Diagnosis not present

## 2022-01-28 ENCOUNTER — Ambulatory Visit: Payer: 59 | Admitting: Family Medicine

## 2022-01-28 ENCOUNTER — Other Ambulatory Visit (HOSPITAL_BASED_OUTPATIENT_CLINIC_OR_DEPARTMENT_OTHER): Payer: Self-pay

## 2022-01-28 ENCOUNTER — Encounter: Payer: Self-pay | Admitting: Family Medicine

## 2022-01-28 VITALS — BP 102/60 | HR 78 | Temp 98.0°F | Ht 63.0 in | Wt 169.0 lb

## 2022-01-28 DIAGNOSIS — N760 Acute vaginitis: Secondary | ICD-10-CM

## 2022-01-28 DIAGNOSIS — B9689 Other specified bacterial agents as the cause of diseases classified elsewhere: Secondary | ICD-10-CM

## 2022-01-28 MED ORDER — METRONIDAZOLE 500 MG PO TABS
500.0000 mg | ORAL_TABLET | Freq: Two times a day (BID) | ORAL | 0 refills | Status: AC
  Filled 2022-01-28: qty 14, 7d supply, fill #0

## 2022-01-28 NOTE — Progress Notes (Signed)
? ?Subjective:  ? ? ? Patient ID: Sarah Lyons, female    DOB: [birth year], 36 y.o.   MRN: [id number] ? ?Chief Complaint  ?Patient presents with  ? Vaginal Discharge  ?  Vaginal discharge with odor that started 2 weeks ago  ? ? ?HPI ?Took PCN on 3-7-throat better.  ?Vag d/c-creamy white.  and odor for 2 wks-bad fishy odor.  No itchy/irrit much.  This feels like BV. ? ?There are no preventive care reminders to display for this patient. ? ?Past Medical History:  ?Diagnosis Date  ? Anemia   ? hx  ? Anxiety   ? Iron deficiency anemia 07/30/2007  ? ? ?Past Surgical History:  ?Procedure Laterality Date  ? BREAST BIOPSY Left 12/01/2019  ? affirm bx calcs ribbon marker, path pending  ? BREAST BIOPSY Left 12/01/2019  ? [place] bx mass heart marker path pending  ? CHOLECYSTECTOMY N/A 09/05/2015  ? Procedure: LAPAROSCOPIC CHOLECYSTECTOMY WITH INTRAOPERATIVE CHOLANGIOGRAM;  Surgeon: Autumn Messing III, MD;  Location: [facility];  Service: General;  Laterality: N/A;  ? MOUTH SURGERY    ? teeth   ? ? ?Outpatient Medications Prior to Visit  ?Medication Sig Dispense Refill  ? ALPRAZolam (XANAX) 0.5 MG tablet Take 1 tablet (0.5 mg total) by mouth 2 (two) times daily as needed for anxiety. 60 tablet 0  ? levonorgestrel (MIRENA) 20 MCG/24HR IUD 1 each by Intrauterine route once.    ? sertraline (ZOLOFT) 50 MG tablet TAKE 1 TABLET BY MOUTH AT BEDTIME. 90 tablet 3  ? spironolactone (ALDACTONE) 100 MG tablet Take 1 tablet (100 mg total) by mouth daily. Spironolactone can cause increased urination and cause blood pressure to decrease. Please watch for signs of lightheadedness and be cautious when changing position. 30 tablet 11  ? COVID-19 mRNA bivalent vaccine, Pfizer, (PFIZER COVID-19 VAC BIVALENT) injection Inject into the muscle. (Patient not taking: Reported on 01/07/2022) 0.3 mL 0  ? ?No facility-administered medications prior to visit.  ? ? ?Allergies  ?Allergen Reactions  ? Sulfonamide Derivatives   ?  REACTION: RASH AS CHILD  ? ?ROS  neg/noncontributory except as noted HPI/below ? ? ?   ?Objective:  ?  ? ?BP 102/60   Pulse 78   Temp 98 ?F (36.7 ?C) (Temporal)   Ht '5\' 3"'$  (1.6 m)   Wt 169 lb (76.7 kg)   LMP 01/27/2022 (Exact Date) Comment: ended on yesterday  SpO2 98%   BMI 29.94 kg/m?  ?Wt Readings from Last 3 Encounters:  ?01/28/22 169 lb (76.7 kg)  ?01/07/22 164 lb 8 oz (74.6 kg)  ?10/08/21 163 lb (73.9 kg)  ? ? ?Physical Exam  ? ?Gen: WDWN NAD wf ?HEENT: NCAT, conjunctiva not injected, sclera nonicteric ?ABDOMEN:  BS+, soft, NTND, No HSM, no masses ?EXT:  no edema ?MSK: no gross abnormalities.  ?NEURO: A&O x3.  CN II-XII intact.  ?PSYCH: normal mood. Good eye contact ? ?   ?Assessment & Plan:  ? ?Problem List Items Addressed This Visit   ?None ?Visit Diagnoses   ? ? Bacterial vaginitis    -  Primary  ? Relevant Medications  ? metroNIDAZOLE (FLAGYL) 500 MG tablet  ? ?  ? Presumed BV-flagyl for 7 days.  No ETOH while on it.  Decided against swab but it not improving, then will return for swab.  ? ?Meds ordered this encounter  ?Medications  ? metroNIDAZOLE (FLAGYL) 500 MG tablet  ?  Sig: Take 1 tablet (500 mg total) by mouth 2 (two) times  daily for 7 days.  ?  Dispense:  14 tablet  ?  Refill:  0  ? ? ?Wellington Hampshire, MD ? ?

## 2022-01-28 NOTE — Patient Instructions (Signed)
Meds sent to pharmacy.  No drinking alcohol while on it.    Let me know if not improving ?

## 2022-02-24 ENCOUNTER — Other Ambulatory Visit (HOSPITAL_BASED_OUTPATIENT_CLINIC_OR_DEPARTMENT_OTHER): Payer: Self-pay

## 2022-02-24 ENCOUNTER — Telehealth: Payer: 59 | Admitting: Physician Assistant

## 2022-02-24 DIAGNOSIS — J020 Streptococcal pharyngitis: Secondary | ICD-10-CM | POA: Diagnosis not present

## 2022-02-24 DIAGNOSIS — T3695XA Adverse effect of unspecified systemic antibiotic, initial encounter: Secondary | ICD-10-CM

## 2022-02-24 DIAGNOSIS — B379 Candidiasis, unspecified: Secondary | ICD-10-CM | POA: Diagnosis not present

## 2022-02-24 MED ORDER — AMOXICILLIN 500 MG PO CAPS
500.0000 mg | ORAL_CAPSULE | Freq: Two times a day (BID) | ORAL | 0 refills | Status: AC
  Filled 2022-02-24: qty 20, 10d supply, fill #0

## 2022-02-24 MED ORDER — FLUCONAZOLE 150 MG PO TABS
150.0000 mg | ORAL_TABLET | Freq: Once | ORAL | 0 refills | Status: AC
  Filled 2022-02-24: qty 2, 3d supply, fill #0

## 2022-02-24 NOTE — Patient Instructions (Signed)
?[name], thank you for joining Mar Daring, PA-C for today's virtual visit.  While this provider is not your primary care provider (PCP), if your PCP is located in our provider database this encounter information will be shared with them immediately following your visit. ? ?Consent: ?(Patient) Sarah Lyons provided verbal consent for this virtual visit at the beginning of the encounter. ? ?Current Medications: ? ?Current Outpatient Medications:  ?  amoxicillin (AMOXIL) 500 MG capsule, Take 1 capsule (500 mg total) by mouth 2 (two) times daily for 10 days., Disp: 20 capsule, Rfl: 0 ?  fluconazole (DIFLUCAN) 150 MG tablet, Take 1 tablet (150 mg total) by mouth once for 1 dose. May repeat in 72 hours if needed, Disp: 2 tablet, Rfl: 0 ?  ALPRAZolam (XANAX) 0.5 MG tablet, Take 1 tablet (0.5 mg total) by mouth 2 (two) times daily as needed for anxiety., Disp: 60 tablet, Rfl: 0 ?  levonorgestrel (MIRENA) 20 MCG/24HR IUD, 1 each by Intrauterine route once., Disp: , Rfl:  ?  sertraline (ZOLOFT) 50 MG tablet, TAKE 1 TABLET BY MOUTH AT BEDTIME., Disp: 90 tablet, Rfl: 3 ?  spironolactone (ALDACTONE) 100 MG tablet, Take 1 tablet (100 mg total) by mouth daily. Spironolactone can cause increased urination and cause blood pressure to decrease. Please watch for signs of lightheadedness and be cautious when changing position., Disp: 30 tablet, Rfl: 11  ? ?Medications ordered in this encounter:  ?Meds ordered this encounter  ?Medications  ? amoxicillin (AMOXIL) 500 MG capsule  ?  Sig: Take 1 capsule (500 mg total) by mouth 2 (two) times daily for 10 days.  ?  Dispense:  20 capsule  ?  Refill:  0  ?  Order Specific Question:   Supervising Provider  ?  Answer:   Noemi Chapel [[id number]]  ? fluconazole (DIFLUCAN) 150 MG tablet  ?  Sig: Take 1 tablet (150 mg total) by mouth once for 1 dose. May repeat in 72 hours if needed  ?  Dispense:  2 tablet  ?  Refill:  0  ?  Order Specific Question:   Supervising Provider  ?   Answer:   Noemi Chapel [[id number]]  ?  ? ?*If you need refills on other medications prior to your next appointment, please contact your pharmacy* ? ?Follow-Up: ?Call back or seek an in-person evaluation if the symptoms worsen or if the condition fails to improve as anticipated. ? ?Other Instructions ?Strep Throat, Adult ?Strep throat is an infection in the throat that is caused by bacteria. It is common during the cold months of the year. It mostly affects children who are 59-80 years old. However, people of all ages can get it at any time of the year. This infection spreads from person to person (is contagious) through coughing, sneezing, or having close contact. ?Your health care provider may use other names to describe the infection. When strep throat affects the tonsils, it is called tonsillitis. When it affects the back of the throat, it is called pharyngitis. ?What are the causes? ?This condition is caused by the Streptococcus pyogenes bacteria. ?What increases the risk? ?You are more likely to develop this condition if: ?You care for school-age children, or are around school-age children. Children are more likely to get strep throat and may spread it to others. ?You spend time in crowded places where the infection can spread easily. ?You have close contact with someone who has strep throat. ?What are the signs or symptoms? ?Symptoms of this  condition include: ?Fever or chills. ?Redness, swelling, or pain in the tonsils or throat. ?Pain or difficulty when swallowing. ?White or yellow spots on the tonsils or throat. ?Tender glands in the neck and under the jaw. ?Bad smelling breath. ?Red rash all over the body. This is rare. ?How is this diagnosed? ?This condition is diagnosed by tests that check for the presence and the amount of bacteria that cause strep throat. They are: ?Rapid strep test. Your throat is swabbed and checked for the presence of bacteria. Results are usually ready in minutes. ?Throat culture test.  Your throat is swabbed. The sample is placed in a cup that allows infections to grow. Results are usually ready in 1 or 2 days. ?How is this treated? ?This condition may be treated with: ?Medicines that kill germs (antibiotics). ?Medicines that relieve pain or fever. These include: ?Ibuprofen or acetaminophen. ?Aspirin, only for people who are over the age of 34. ?Throat lozenges. ?Throat sprays. ?Follow these instructions at home: ?Medicines ? ?Take over-the-counter and prescription medicines only as told by your health care provider. ?Take your antibiotic medicine as told by your health care provider. Do not stop taking the antibiotic even if you start to feel better. ?Eating and drinking ? ?If you have trouble swallowing, try eating soft foods until your sore throat feels better. ?Drink enough fluid to keep your urine pale yellow. ?To help relieve pain, you may have: ?Warm fluids, such as soup and tea. ?Cold fluids, such as frozen desserts or popsicles. ?General instructions ?Gargle with a salt-water mixture 3-4 times a day or as needed. To make a salt-water mixture, completely dissolve ?-1 tsp (3-6 g) of salt in 1 cup (237 mL) of warm water. ?Get plenty of rest. ?Stay home from work or school until you have been taking antibiotics for 24 hours. ?Do not use any products that contain nicotine or tobacco. These products include cigarettes, chewing tobacco, and vaping devices, such as e-cigarettes. If you need help quitting, ask your health care provider. ?It is up to you to get your test results. Ask your health care provider, or the department that is doing the test, when your results will be ready. ?Keep all follow-up visits. This is important. ?How is this prevented? ? ?Do not share food, drinking cups, or personal items that could cause the infection to spread to other people. ?Wash your hands often with soap and water for at least 20 seconds. If soap and water are not available, use hand sanitizer. Make sure  that all people in your house wash their hands well. ?Have family members tested if they have a sore throat or fever. They may need an antibiotic if they have strep throat. ?Contact a health care provider if: ?You have swelling in your neck that keeps getting bigger. ?You develop a rash, cough, or earache. ?You cough up a thick mucus that is green, yellow-brown, or bloody. ?You have pain or discomfort that does not get better with medicine. ?Your symptoms seem to be getting worse. ?You have a fever. ?Get help right away if: ?You have new symptoms, such as vomiting, severe headache, stiff or painful neck, chest pain, or shortness of breath. ?You have severe throat pain, drooling, or changes in your voice. ?You have swelling of the neck, or the skin on the neck becomes red and tender. ?You have signs of dehydration, such as tiredness (fatigue), dry mouth, and decreased urination. ?You become increasingly sleepy, or you cannot wake up completely. ?Your joints become red  or painful. ?These symptoms may represent a serious problem that is an emergency. Do not wait to see if the symptoms will go away. Get medical help right away. Call your local emergency services (911 in the [place] Do not drive yourself to the hospital. ?Summary ?Strep throat is an infection in the throat that is caused by the Streptococcus pyogenes bacteria. This infection is spread from person to person (is contagious) through coughing, sneezing, or having close contact. ?Take your medicines, including antibiotics, as told by your health care provider. Do not stop taking the antibiotic even if you start to feel better. ?To prevent the spread of germs, wash your hands well with soap and water. Have others do the same. Do not share food, drinking cups, or personal items. ?Get help right away if you have new symptoms, such as vomiting, severe headache, stiff or painful neck, chest pain, or shortness of breath. ?This information is not intended to replace  advice given to you by your health care provider. Make sure you discuss any questions you have with your health care provider. ?Document Revised: 02/12/2021 Document Reviewed: 02/12/2021 ?Else[employer]

## 2022-02-24 NOTE — Progress Notes (Signed)
?Virtual Visit Consent  ? ?Sarah Lyons, you are scheduled for a virtual visit with a [facility] provider today.   ?  ?Just as with appointments in the office, your consent must be obtained to participate.  Your consent will be active for this visit and any virtual visit you may have with one of our providers in the next 365 days.   ?  ?If you have a MyChart account, a copy of this consent can be sent to you electronically.  All virtual visits are billed to your insurance company just like a traditional visit in the office.   ? ?As this is a virtual visit, video technology does not allow for your provider to perform a traditional examination.  This may limit your provider's ability to fully assess your condition.  If your provider identifies any concerns that need to be evaluated in person or the need to arrange testing (such as labs, EKG, etc.), we will make arrangements to do so.   ?  ?Although advances in technology are sophisticated, we cannot ensure that it will always work on either your end or our end.  If the connection with a video visit is poor, the visit may have to be switched to a telephone visit.  With either a video or telephone visit, we are not always able to ensure that we have a secure connection.    ? ?Also, by engaging in this virtual visit, you consent to the provision of healthcare. Additionally, you authorize for your insurance to be billed (if applicable) for the services provided during this visit.  ? ?I need to obtain your verbal consent now.   Are you willing to proceed with your visit today?  ?  ?Sevin Farone has provided verbal consent on 02/24/2022 for a virtual visit (video or telephone). ?  ?Mar Daring, PA-C  ? ?Date: 02/24/2022 9:48 AM ? ? ?Virtual Visit via Video Note  ? ?Sarah Lyons, connected with  Sarah Lyons  ([id number], Dec 22, 1985) on 02/24/22 at  9:45 AM EDT by a video-enabled telemedicine application and verified that I am speaking with  the correct person using two identifiers. ? ?Location: ?Patient: Virtual Visit Location Patient: Home ?Provider: Virtual Visit Location Provider: Home Office ?  ?I discussed the limitations of evaluation and management by telemedicine and the availability of in person appointments. The patient expressed understanding and agreed to proceed.   ? ?History of Present Illness: ?Sarah Lyons is a 36 y.o. who identifies as a female who was assigned female at birth, and is being seen today for sore throat. ? ?HPI: Sore Throat  ?This is a new problem. The current episode started yesterday. The problem has been gradually worsening. The maximum temperature recorded prior to her arrival was 100.4 - 100.9 F. Associated symptoms include coughing, headaches, a hoarse voice, swollen glands and trouble swallowing. Pertinent negatives include no congestion. Associated symptoms comments: Body aches. She has had exposure to strep. She has tried cool liquids, acetaminophen, NSAIDs and gargles (nyquil) for the symptoms. The treatment provided no relief.   ? ? ?Problems:  ?Patient Active Problem List  ? Diagnosis Date Noted  ? Acne vulgaris 04/25/2021  ? IUD (intrauterine device) in place 04/04/2020  ? Situational depression 01/17/2019  ? Situational anxiety 11/17/2018  ? Migraine without aura 08/30/2007  ?  ?Allergies:  ?Allergies  ?Allergen Reactions  ? Sulfonamide Derivatives   ?  REACTION: RASH AS CHILD  ? ?Medications:  ?Current Outpatient  Medications:  ?  amoxicillin (AMOXIL) 500 MG capsule, Take 1 capsule (500 mg total) by mouth 2 (two) times daily for 10 days., Disp: 20 capsule, Rfl: 0 ?  fluconazole (DIFLUCAN) 150 MG tablet, Take 1 tablet (150 mg total) by mouth once for 1 dose. May repeat in 72 hours if needed, Disp: 2 tablet, Rfl: 0 ?  ALPRAZolam (XANAX) 0.5 MG tablet, Take 1 tablet (0.5 mg total) by mouth 2 (two) times daily as needed for anxiety., Disp: 60 tablet, Rfl: 0 ?  levonorgestrel (MIRENA) 20 MCG/24HR IUD, 1  each by Intrauterine route once., Disp: , Rfl:  ?  sertraline (ZOLOFT) 50 MG tablet, TAKE 1 TABLET BY MOUTH AT BEDTIME., Disp: 90 tablet, Rfl: 3 ?  spironolactone (ALDACTONE) 100 MG tablet, Take 1 tablet (100 mg total) by mouth daily. Spironolactone can cause increased urination and cause blood pressure to decrease. Please watch for signs of lightheadedness and be cautious when changing position., Disp: 30 tablet, Rfl: 11 ? ?Observations/Objective: ?Patient is well-developed, well-nourished in no acute distress.  ?Resting comfortably at home.  ?Head is normocephalic, atraumatic.  ?No labored breathing.  ?Speech is clear and coherent with logical content.  ?Patient is alert and oriented at baseline.  ?Patient reports posterior pharynx is red, tonsils appear swollen with white exudate ? ?Assessment and Plan: ?1. Strep throat ?- amoxicillin (AMOXIL) 500 MG capsule; Take 1 capsule (500 mg total) by mouth 2 (two) times daily for 10 days.  Dispense: 20 capsule; Refill: 0 ? ?2. Antibiotic-induced yeast infection ?- fluconazole (DIFLUCAN) 150 MG tablet; Take 1 tablet (150 mg total) by mouth once for 1 dose. May repeat in 72 hours if needed  Dispense: 2 tablet; Refill: 0 ? ?- Suspect strep throat ?- Amoxicillin prescribed ?- Tylenol and Ibuprofen alternating every 4 hours ?- Salt water gargles ?- Chloraseptic spray ?- Liquid and soft food diet ?- Push fluids ?- Seek in person evaluation if not improving or if symptoms worsen ? ? ?Follow Up Instructions: ?I discussed the assessment and treatment plan with the patient. The patient was provided an opportunity to ask questions and all were answered. The patient agreed with the plan and demonstrated an understanding of the instructions.  A copy of instructions were sent to the patient via MyChart unless otherwise noted below.  ? ? ?The patient was advised to call back or seek an in-person evaluation if the symptoms worsen or if the condition fails to improve as  anticipated. ? ?Time:  ?I spent 9 minutes with the patient via telehealth technology discussing the above problems/concerns.   ? ?Mar Daring, PA-C ?

## 2022-03-05 ENCOUNTER — Other Ambulatory Visit (HOSPITAL_BASED_OUTPATIENT_CLINIC_OR_DEPARTMENT_OTHER): Payer: Self-pay

## 2022-03-05 MED ORDER — SPIRONOLACTONE 100 MG PO TABS
ORAL_TABLET | ORAL | 3 refills | Status: DC
  Filled 2022-03-05: qty 90, 90d supply, fill #0

## 2022-03-26 ENCOUNTER — Other Ambulatory Visit (HOSPITAL_BASED_OUTPATIENT_CLINIC_OR_DEPARTMENT_OTHER): Payer: Self-pay

## 2022-03-26 ENCOUNTER — Other Ambulatory Visit: Payer: Self-pay | Admitting: Family Medicine

## 2022-03-26 DIAGNOSIS — F4321 Adjustment disorder with depressed mood: Secondary | ICD-10-CM

## 2022-03-26 MED ORDER — SERTRALINE HCL 50 MG PO TABS
ORAL_TABLET | ORAL | 3 refills | Status: DC
  Filled 2022-03-26: qty 90, 90d supply, fill #0
  Filled 2022-06-20: qty 90, 90d supply, fill #1
  Filled 2022-10-17: qty 90, 90d supply, fill #2
  Filled 2023-02-25: qty 90, 90d supply, fill #3

## 2022-05-13 ENCOUNTER — Ambulatory Visit: Payer: 59 | Admitting: Dermatology

## 2022-05-30 ENCOUNTER — Ambulatory Visit (INDEPENDENT_AMBULATORY_CARE_PROVIDER_SITE_OTHER): Payer: 59

## 2022-05-30 ENCOUNTER — Encounter: Payer: Self-pay | Admitting: Podiatry

## 2022-05-30 ENCOUNTER — Ambulatory Visit (INDEPENDENT_AMBULATORY_CARE_PROVIDER_SITE_OTHER): Payer: 59 | Admitting: Podiatry

## 2022-05-30 ENCOUNTER — Other Ambulatory Visit: Payer: Self-pay | Admitting: Podiatry

## 2022-05-30 DIAGNOSIS — M84375A Stress fracture, left foot, initial encounter for fracture: Secondary | ICD-10-CM

## 2022-05-30 DIAGNOSIS — M84374A Stress fracture, right foot, initial encounter for fracture: Secondary | ICD-10-CM

## 2022-05-30 DIAGNOSIS — M79672 Pain in left foot: Secondary | ICD-10-CM | POA: Diagnosis not present

## 2022-05-30 DIAGNOSIS — M205X1 Other deformities of toe(s) (acquired), right foot: Secondary | ICD-10-CM

## 2022-05-30 DIAGNOSIS — M79671 Pain in right foot: Secondary | ICD-10-CM

## 2022-05-30 NOTE — Progress Notes (Signed)
Subjective:   Patient ID: Sarah Lyons, female   DOB: 36 y.o.   MRN: [id number]   HPI Patient presents stating that she has had chronic pain in her big toe joint and plantar for the last approximate 5 years with history of cortisone injections and states she has not had one for a while and that is been getting sore and then she has developed acute discomfort on top of her right foot which is very painful and makes it hard to walk.  Patient does not smoke likes to be active if possible   Review of Systems  All other systems reviewed and are negative.       Objective:  Physical Exam Vitals and nursing note reviewed.  Constitutional:      Appearance: She is well-developed.  Pulmonary:     Effort: Pulmonary effort is normal.  Musculoskeletal:        General: Normal range of motion.  Skin:    General: Skin is warm.  Neurological:     Mental Status: She is alert.     Neurovascular status found to be intact muscle strength found to be adequate range of motion within normal limits.  Patient does have discomfort which appears to be more within the first MPJ right over left and then has acute discomfort in the second metatarsal shaft right with swelling of a moderate nature in this area.  Patient did not have significant sesamoidal pain but kind of difficult to ascertain if she has not been active recently and does have good digital perfusion well oriented x3     Assessment:  Possibility for acute stress fracture dorsal right along with functional hallux limitus with elongated metatarsal first bilateral which may be creating stress on the surface     Plan:  H&P x-rays reviewed all conditions discussed.  At this point I have recommended immobilization due to probability of stress fracture I applied air fracture walker and I did discuss the long first metatarsal possibility for functional hallux limitus versus sesamoiditis and I will make that determination based on the response first  to this conservative treatment plan.  Reappoint 3 weeks or earlier if necessary  X-rays indicate elongated first metatarsal segment bilateral with the possibility for reactivity of the distal shaft second metatarsal right which will have to be looked at again in 3 weeks with new x-ray

## 2022-06-11 ENCOUNTER — Encounter (INDEPENDENT_AMBULATORY_CARE_PROVIDER_SITE_OTHER): Payer: Self-pay

## 2022-06-20 ENCOUNTER — Other Ambulatory Visit: Payer: Self-pay | Admitting: Family Medicine

## 2022-06-20 ENCOUNTER — Ambulatory Visit (INDEPENDENT_AMBULATORY_CARE_PROVIDER_SITE_OTHER): Payer: 59 | Admitting: Podiatry

## 2022-06-20 ENCOUNTER — Other Ambulatory Visit (HOSPITAL_BASED_OUTPATIENT_CLINIC_OR_DEPARTMENT_OTHER): Payer: Self-pay

## 2022-06-20 ENCOUNTER — Ambulatory Visit (INDEPENDENT_AMBULATORY_CARE_PROVIDER_SITE_OTHER): Payer: 59

## 2022-06-20 DIAGNOSIS — M84374A Stress fracture, right foot, initial encounter for fracture: Secondary | ICD-10-CM

## 2022-06-20 DIAGNOSIS — M205X1 Other deformities of toe(s) (acquired), right foot: Secondary | ICD-10-CM | POA: Diagnosis not present

## 2022-06-20 MED ORDER — SPIRONOLACTONE 50 MG PO TABS
100.0000 mg | ORAL_TABLET | Freq: Every day | ORAL | 3 refills | Status: DC
  Filled 2022-06-20: qty 180, 90d supply, fill #0
  Filled 2022-10-17: qty 180, 90d supply, fill #1
  Filled 2023-02-25: qty 180, 90d supply, fill #2
  Filled 2023-06-18: qty 180, 90d supply, fill #3

## 2022-06-20 NOTE — Telephone Encounter (Signed)
Please send note to patient; due for annual exam. thanks

## 2022-06-22 NOTE — Progress Notes (Signed)
Subjective:   Patient ID: Sarah Lyons, female   DOB: 36 y.o.   MRN: [id number]   HPI Patient presents stating that she is improved with the boot on but still having some discomfort in her second metatarsal distal shaft and also is concerned about the chronic pain that she gets in her big toe joint right over left that is been present for years   ROS      Objective:  Physical Exam  There neurovascular status intact with area of continued mild to moderate edema around the second metatarsal distal shaft and what appears to be functional hallux limitus with inflammation of the big toe joint right over left of number of years duration     Assessment:  Probability for stress fracture right along with elongated elevated first metatarsal segment bilateral creating chronic inflammation and pain of the joint surfaces when active    Plan:  H&P reviewed condition and for stress fracture reviewed x-ray and it appears there is a small fracture medial side right distal shaft second and I then discussed the structural elongation elevation deformity and we discussed orthotics shoe gear modifications and the consideration for shortening plantarflexion of the osteotomies.  At 1 point she would like to pursue a permanent solution I do think that the best chance she has we will discuss that and she will look at her to skip schedule decide when it is best and scheduled to see me prior to the surgical correction necessary  X-rays indicate slight change in the medial side second metatarsal distal shaft with elongated elevated first metatarsal segment noted

## 2022-06-23 ENCOUNTER — Other Ambulatory Visit (HOSPITAL_BASED_OUTPATIENT_CLINIC_OR_DEPARTMENT_OTHER): Payer: Self-pay

## 2022-06-23 ENCOUNTER — Encounter (HOSPITAL_BASED_OUTPATIENT_CLINIC_OR_DEPARTMENT_OTHER): Payer: Self-pay

## 2022-06-24 ENCOUNTER — Other Ambulatory Visit (HOSPITAL_BASED_OUTPATIENT_CLINIC_OR_DEPARTMENT_OTHER): Payer: Self-pay

## 2022-07-03 ENCOUNTER — Ambulatory Visit (INDEPENDENT_AMBULATORY_CARE_PROVIDER_SITE_OTHER): Payer: 59 | Admitting: Podiatry

## 2022-07-03 ENCOUNTER — Encounter: Payer: Self-pay | Admitting: Podiatry

## 2022-07-03 DIAGNOSIS — M84374A Stress fracture, right foot, initial encounter for fracture: Secondary | ICD-10-CM

## 2022-07-03 DIAGNOSIS — M205X1 Other deformities of toe(s) (acquired), right foot: Secondary | ICD-10-CM | POA: Diagnosis not present

## 2022-07-03 NOTE — Progress Notes (Signed)
Subjective:   Patient ID: Sarah Lyons, female   DOB: 36 y.o.   MRN: [id number]   HPI Patient states that she has had chronic pain in the big toe joint right over left that is been going on for years.  States that she wants to get the joints fixed and probably will do them around a month apart and wants to do the right 1 first and states the second metatarsal is feeling better   ROS      Objective:  Physical Exam  Neuro vascular status found to be intact with patient found to have what appears to be a significant functional hallux limitus deformity right first MPJ right over left with slight spurring of the dorsal surface right first metatarsal.  There is pain with the joint she did wear heels last night so we can flared up and is quite sore in the joint right over left foot     Assessment:  Probability for structure elongation elevation segment of the first metatarsal creating compression on the joint surface leading to chronic discomfort that is not responding conservatively of the joint right over left     Plan:  H&P reviewed condition discussed at great length and reviewed the considerations for treatment options surgically and conservatively.  She is opted for surgical intervention and I recommended a shortening plantarflexion of the osteotomy right first metatarsal possibly followed by the left and I did discuss the joint surface and the possibility for arthritis of the joint.  She at this time read consent form going over alternative treatments complications that there is absolutely no long-term guarantees of success in the total recovery can take approximately 6 months.  Patient scheduled outpatient surgery all questions answered and she will have the surgery at the end of the month

## 2022-07-08 ENCOUNTER — Telehealth: Payer: Self-pay | Admitting: Urology

## 2022-07-08 NOTE — Telephone Encounter (Signed)
DOS - 07/29/22  [place] BUNIONECTOMY RIGHT --- [id number]  [facility] EFFECTIVE DATE - 11/03/21  PLAN DEDUCTIBLE - This does not apply to your plan OUT OF POCKET - $4,000.00 W/ $0,699.96 REMAINING COINSURANCE - 40% COPAY - $0.00  SPOKE WITH NOVELLA WITH [facility] AND SHE STATED THAT FOR CPT CODE 72277 NO PRIOR AUTH IS REQUIRED.  REF # [id number]

## 2022-07-21 ENCOUNTER — Other Ambulatory Visit (HOSPITAL_BASED_OUTPATIENT_CLINIC_OR_DEPARTMENT_OTHER): Payer: Self-pay

## 2022-07-21 MED ORDER — INFLUENZA VAC SPLIT QUAD 0.5 ML IM SUSY
PREFILLED_SYRINGE | INTRAMUSCULAR | 0 refills | Status: DC
  Filled 2022-07-21: qty 0.5, 1d supply, fill #0

## 2022-07-28 ENCOUNTER — Other Ambulatory Visit (HOSPITAL_BASED_OUTPATIENT_CLINIC_OR_DEPARTMENT_OTHER): Payer: Self-pay

## 2022-07-28 ENCOUNTER — Encounter: Payer: Self-pay | Admitting: *Deleted

## 2022-07-28 MED ORDER — ONDANSETRON HCL 4 MG PO TABS
4.0000 mg | ORAL_TABLET | Freq: Three times a day (TID) | ORAL | 0 refills | Status: DC | PRN
  Filled 2022-07-28: qty 20, 7d supply, fill #0

## 2022-07-28 MED ORDER — OXYCODONE-ACETAMINOPHEN 10-325 MG PO TABS
1.0000 | ORAL_TABLET | Freq: Four times a day (QID) | ORAL | 0 refills | Status: DC | PRN
  Filled 2022-07-28: qty 20, 5d supply, fill #0

## 2022-07-28 NOTE — Addendum Note (Signed)
Addended by: Wallene Huh on: 07/28/2022 04:42 PM   Modules accepted: Orders

## 2022-07-29 ENCOUNTER — Encounter: Payer: Self-pay | Admitting: Podiatry

## 2022-07-29 DIAGNOSIS — M2011 Hallux valgus (acquired), right foot: Secondary | ICD-10-CM | POA: Diagnosis not present

## 2022-08-04 ENCOUNTER — Encounter: Payer: Self-pay | Admitting: Podiatry

## 2022-08-04 ENCOUNTER — Ambulatory Visit (INDEPENDENT_AMBULATORY_CARE_PROVIDER_SITE_OTHER): Payer: 59

## 2022-08-04 ENCOUNTER — Ambulatory Visit (INDEPENDENT_AMBULATORY_CARE_PROVIDER_SITE_OTHER): Payer: 59 | Admitting: Podiatry

## 2022-08-04 DIAGNOSIS — M205X1 Other deformities of toe(s) (acquired), right foot: Secondary | ICD-10-CM

## 2022-08-04 DIAGNOSIS — Z9889 Other specified postprocedural states: Secondary | ICD-10-CM | POA: Diagnosis not present

## 2022-08-04 NOTE — Progress Notes (Signed)
Subjective:   Patient ID: Sarah Lyons, female   DOB: 36 y.o.   MRN: [id number]   HPI Patient states she is doing very well with surgery   ROS      Objective:  Physical Exam  Neurovascular status intact negative Bevelyn Buckles' sign noted right foot healing well wound edges coapted well range of motion excellent no crepitus of the joint     Assessment:  Doing well post osteotomy first metatarsal right with good shortening plantarflexion     Plan:  H&P reviewed x-rays and recommended the continuation of conservative immobilization elevation compression and begin range of motion exercises and patient will be seen back to recheck again in 3 weeks or earlier if needed  X-rays indicate osteotomies healing well fixation in place bone in good alignment

## 2022-08-05 ENCOUNTER — Encounter: Payer: Self-pay | Admitting: Podiatry

## 2022-08-25 ENCOUNTER — Ambulatory Visit (INDEPENDENT_AMBULATORY_CARE_PROVIDER_SITE_OTHER): Payer: 59 | Admitting: Podiatry

## 2022-08-25 ENCOUNTER — Ambulatory Visit (INDEPENDENT_AMBULATORY_CARE_PROVIDER_SITE_OTHER): Payer: 59

## 2022-08-25 DIAGNOSIS — M205X1 Other deformities of toe(s) (acquired), right foot: Secondary | ICD-10-CM

## 2022-08-25 NOTE — Progress Notes (Signed)
Subjective:   Patient ID: Sarah Lyons, female   DOB: 36 y.o.   MRN: [id number]   HPI Patient states doing great with surgery very pleased   ROS      Objective:  Physical Exam  Neurovascular status intact negative Bevelyn Buckles' sign noted right foot healing well wound edges well coapted good range of motion no crepitus of the joint     Assessment:  Doing well post osteotomy right first metatarsal     Plan:  H&P x-rays reviewed and discussed continued range of motion gradual return to soft shoe gear ankle compression stocking dispensed reappoint to recheck 6 weeks or earlier if needed

## 2022-09-04 ENCOUNTER — Encounter: Payer: Self-pay | Admitting: Podiatry

## 2022-10-06 ENCOUNTER — Ambulatory Visit (INDEPENDENT_AMBULATORY_CARE_PROVIDER_SITE_OTHER): Payer: 59

## 2022-10-06 DIAGNOSIS — M205X1 Other deformities of toe(s) (acquired), right foot: Secondary | ICD-10-CM

## 2022-10-06 DIAGNOSIS — Z9889 Other specified postprocedural states: Secondary | ICD-10-CM

## 2022-10-06 NOTE — Progress Notes (Signed)
Patient presents today for post op visit # 3, patient of Dr. Paulla Dolly.   POV # 3 DOS 07/29/25 Sarah Lyons RT    She presents in her soft supportive shoe. Denies any falls or injury to the foot. Foot is swollen. No signs of infection. No calf pain or shortness of breath. Incision is intact.   BP: 106/68 P: 73    Xrays taken today and reviewed by Dr. Paulla Dolly. He did take a look at her foot today as well.   Reviewed icing and elevation. She will not need any follow-up at this point.

## 2022-10-16 ENCOUNTER — Encounter: Payer: Self-pay | Admitting: *Deleted

## 2022-11-04 ENCOUNTER — Other Ambulatory Visit (HOSPITAL_BASED_OUTPATIENT_CLINIC_OR_DEPARTMENT_OTHER): Payer: Self-pay

## 2022-12-23 ENCOUNTER — Other Ambulatory Visit (HOSPITAL_BASED_OUTPATIENT_CLINIC_OR_DEPARTMENT_OTHER): Payer: Self-pay

## 2022-12-23 ENCOUNTER — Telehealth: Payer: 59 | Admitting: Family Medicine

## 2022-12-23 DIAGNOSIS — B9689 Other specified bacterial agents as the cause of diseases classified elsewhere: Secondary | ICD-10-CM | POA: Diagnosis not present

## 2022-12-23 DIAGNOSIS — N760 Acute vaginitis: Secondary | ICD-10-CM | POA: Diagnosis not present

## 2022-12-23 MED ORDER — METRONIDAZOLE 500 MG PO TABS
500.0000 mg | ORAL_TABLET | Freq: Two times a day (BID) | ORAL | 0 refills | Status: AC
  Filled 2022-12-23: qty 14, 7d supply, fill #0

## 2022-12-23 NOTE — Progress Notes (Signed)
E-Visit for Vaginal Symptoms  We are sorry that you are not feeling well. Here is how we plan to help! Based on what you shared with me it looks like you: May have a vaginosis due to bacteria  Vaginosis is an inflammation of the vagina that can result in discharge, itching and pain. The cause is usually a change in the normal balance of vaginal bacteria or an infection. Vaginosis can also result from reduced estrogen levels after menopause.  The most common causes of vaginosis are:   Bacterial vaginosis which results from an overgrowth of one on several organisms that are normally present in your vagina.   Yeast infections which are caused by a naturally occurring fungus called candida.   Vaginal atrophy (atrophic vaginosis) which results from the thinning of the vagina from reduced estrogen levels after menopause.   Trichomoniasis which is caused by a parasite and is commonly transmitted by sexual intercourse.  Factors that increase your risk of developing vaginosis include: Medications, such as antibiotics and steroids Uncontrolled diabetes Use of hygiene products such as bubble bath, vaginal spray or vaginal deodorant Douching Wearing damp or tight-fitting clothing Using an intrauterine device (IUD) for birth control Hormonal changes, such as those associated with pregnancy, birth control pills or menopause Sexual activity Having a sexually transmitted infection  Your treatment plan is Metronidazole or Flagyl 500mg twice a day for 7 days.  I have electronically sent this prescription into the pharmacy that you have chosen.  Be sure to take all of the medication as directed. Stop taking any medication if you develop a rash, tongue swelling or shortness of breath. Mothers who are breast feeding should consider pumping and discarding their breast milk while on these antibiotics. However, there is no consensus that infant exposure at these doses would be harmful.  Remember that  medication creams can weaken latex condoms. .   HOME CARE:  Good hygiene may prevent some types of vaginosis from recurring and may relieve some symptoms:  Avoid baths, hot tubs and whirlpool spas. Rinse soap from your outer genital area after a shower, and dry the area well to prevent irritation. Don't use scented or harsh soaps, such as those with deodorant or antibacterial action. Avoid irritants. These include scented tampons and pads. Wipe from front to back after using the toilet. Doing so avoids spreading fecal bacteria to your vagina.  Other things that may help prevent vaginosis include:  Don't douche. Your vagina doesn't require cleansing other than normal bathing. Repetitive douching disrupts the normal organisms that reside in the vagina and can actually increase your risk of vaginal infection. Douching won't clear up a vaginal infection. Use a latex condom. Both female and female latex condoms may help you avoid infections spread by sexual contact. Wear cotton underwear. Also wear pantyhose with a cotton crotch. If you feel comfortable without it, skip wearing underwear to bed. Yeast thrives in moist environments Your symptoms should improve in the next day or two.  GET HELP RIGHT AWAY IF:  You have pain in your lower abdomen ( pelvic area or over your ovaries) You develop nausea or vomiting You develop a fever Your discharge changes or worsens You have persistent pain with intercourse You develop shortness of breath, a rapid pulse, or you faint.  These symptoms could be signs of problems or infections that need to be evaluated by a medical provider now.  MAKE SURE YOU   Understand these instructions. Will watch your condition. Will get help right   away if you are not doing well or get worse.  Thank you for choosing an e-visit.  Your e-visit answers were reviewed by a board certified advanced clinical practitioner to complete your personal care plan. Depending upon the  condition, your plan could have included both over the counter or prescription medications.  Please review your pharmacy choice. Make sure the pharmacy is open so you can pick up prescription now. If there is a problem, you may contact your provider through [employer] and have the prescription routed to another pharmacy.  Your safety is important to [place]. If you have drug allergies check your prescription carefully.   For the next 24 hours you can use MyChart to ask questions about today's visit, request a non-urgent call back, or ask for a work or school excuse. You will get an email in the next two days asking about your experience. I hope that your e-visit has been valuable and will speed your recovery.  I provided 5 minutes of non face-to-face time during this encounter for chart review, medication and order placement, as well as and documentation.

## 2022-12-24 ENCOUNTER — Encounter: Payer: Self-pay | Admitting: Physician Assistant

## 2022-12-24 ENCOUNTER — Ambulatory Visit: Payer: 59 | Admitting: Physician Assistant

## 2022-12-24 ENCOUNTER — Other Ambulatory Visit (HOSPITAL_BASED_OUTPATIENT_CLINIC_OR_DEPARTMENT_OTHER): Payer: Self-pay

## 2022-12-24 VITALS — BP 98/60 | HR 69 | Temp 97.1°F | Ht 63.0 in | Wt 177.0 lb

## 2022-12-24 DIAGNOSIS — N9089 Other specified noninflammatory disorders of vulva and perineum: Secondary | ICD-10-CM | POA: Diagnosis not present

## 2022-12-24 MED ORDER — VALACYCLOVIR HCL 1 G PO TABS
ORAL_TABLET | ORAL | 0 refills | Status: DC
  Filled 2022-12-24: qty 30, 13d supply, fill #0

## 2022-12-24 NOTE — Patient Instructions (Addendum)
It was great to see you!  I will be in touch with your results. Go ahead and start your valtrex while we await results.  Please read additional information regarding HSV.  Take care,  [name] PA-C

## 2022-12-24 NOTE — Progress Notes (Signed)
Sarah Lyons is a 37 y.o. female here for a new problem.  History of Present Illness:   Chief Complaint  Patient presents with   Vaginal Discharge    Pt c/o clear vaginal discharge with odor x few day, sore left labia, itching. Had an E-visit yesterday and was given antibiotic for BV.    Vaginal discharge Patient endorses vaginal d/c with odor x few days Did e-visit yesterday for suspected BV and received metronidazole She has started taking this She noticed a few bumps on vulvar area that are painful and itchy She has never experienced this before No prior hx of STD Per chart review, has no prior HSV test  Past Medical History:  Diagnosis Date   Anemia    hx   Anxiety    Iron deficiency anemia 07/30/2007     Social History   Tobacco Use   Smoking status: Never   Smokeless tobacco: Never  Substance Use Topics   Alcohol use: Yes    Alcohol/week: 7.0 standard drinks of alcohol    Types: 4 Glasses of wine, 3 Cans of beer per week   Drug use: No    Past Surgical History:  Procedure Laterality Date   BREAST BIOPSY Left 12/01/2019   affirm bx calcs ribbon marker, path pending   BREAST BIOPSY Left 12/01/2019   [place] bx mass heart marker path pending   CHOLECYSTECTOMY N/A 09/05/2015   Procedure: LAPAROSCOPIC CHOLECYSTECTOMY WITH INTRAOPERATIVE CHOLANGIOGRAM;  Surgeon: Autumn Messing III, MD;  Location: [facility];  Service: General;  Laterality: N/A;   MOUTH SURGERY     teeth     Family History  Problem Relation Age of Onset   Breast cancer Paternal 4    Depression Mother    Prostate cancer Father    Hypertension Father    Healthy Daughter    Healthy Son    Birth defects Maternal Grandfather        ? sarcoma   Diabetes Neg Hx    Hyperlipidemia Neg Hx     Allergies  Allergen Reactions   Sulfonamide Derivatives     REACTION: RASH AS CHILD    Current Medications:   Current Outpatient Medications:    ALPRAZolam (XANAX) 0.5 MG tablet, Take 1 tablet (0.5  mg total) by mouth 2 (two) times daily as needed for anxiety., Disp: 60 tablet, Rfl: 0   levonorgestrel (MIRENA) 20 MCG/24HR IUD, 1 each by Intrauterine route once., Disp: , Rfl:    metroNIDAZOLE (FLAGYL) 500 MG tablet, Take 1 tablet (500 mg total) by mouth 2 (two) times daily for 7 days., Disp: 14 tablet, Rfl: 0   sertraline (ZOLOFT) 50 MG tablet, TAKE 1 TABLET BY MOUTH AT BEDTIME., Disp: 90 tablet, Rfl: 3   spironolactone (ALDACTONE) 50 MG tablet, Take 2 tablets (100 mg total) by mouth daily., Disp: 180 tablet, Rfl: 3   Review of Systems:   ROS Negative unless otherwise specified per HPI.  Vitals:   Vitals:   12/24/22 1516  BP: 98/60  Pulse: 69  Temp: (!) 97.1 F (36.2 C)  TempSrc: Temporal  SpO2: 99%  Weight: 177 lb (80.3 kg)  Height: 5' 3"$  (1.6 m)     Body mass index is 31.35 kg/m.  Physical Exam:   Physical Exam Vitals and nursing note reviewed.  Constitutional:      General: She is not in acute distress.    Appearance: She is well-developed. She is not ill-appearing or toxic-appearing.  Cardiovascular:  Rate and Rhythm: Normal rate and regular rhythm.     Pulses: Normal pulses.     Heart sounds: Normal heart sounds, S1 normal and S2 normal.  Pulmonary:     Effort: Pulmonary effort is normal.     Breath sounds: Normal breath sounds.  Genitourinary:    Comments: Approximately 3 vesicles with erythematous base to L lower outer labia; TTP Skin:    General: Skin is warm and dry.  Neurological:     Mental Status: She is alert.     GCS: GCS eye subscore is 4. GCS verbal subscore is 5. GCS motor subscore is 6.  Psychiatric:        Speech: Speech normal.        Behavior: Behavior normal. Behavior is cooperative.     Assessment and Plan:   Labial lesion Suspect HSV Will perform blood testing Start empiric valtrex as prescribed HSV handout provided Follow-up if new/worsening sx or concerns    [name], PA-C

## 2022-12-25 LAB — HSV(HERPES SIMPLEX VRS) I + II AB-IGG
HAV 1 IGG,TYPE SPECIFIC AB: <0.9 index
HSV 2 IGG,TYPE SPECIFIC AB: <0.9 index

## 2022-12-26 DIAGNOSIS — N760 Acute vaginitis: Secondary | ICD-10-CM | POA: Diagnosis not present

## 2022-12-26 DIAGNOSIS — N766 Ulceration of vulva: Secondary | ICD-10-CM | POA: Diagnosis not present

## 2023-01-06 ENCOUNTER — Telehealth: Payer: 59 | Admitting: Physician Assistant

## 2023-01-06 ENCOUNTER — Other Ambulatory Visit (HOSPITAL_BASED_OUTPATIENT_CLINIC_OR_DEPARTMENT_OTHER): Payer: Self-pay

## 2023-01-06 DIAGNOSIS — J020 Streptococcal pharyngitis: Secondary | ICD-10-CM

## 2023-01-06 DIAGNOSIS — Z20818 Contact with and (suspected) exposure to other bacterial communicable diseases: Secondary | ICD-10-CM

## 2023-01-06 MED ORDER — AMOXICILLIN 500 MG PO CAPS
500.0000 mg | ORAL_CAPSULE | Freq: Two times a day (BID) | ORAL | 0 refills | Status: AC
  Filled 2023-01-06: qty 20, 10d supply, fill #0

## 2023-01-06 NOTE — Progress Notes (Signed)
I have spent 5 minutes in review of e-visit questionnaire, review and updating patient chart, medical decision making and response to patient.   Jessica Checketts Cody Deisha Stull, PA-C    

## 2023-01-06 NOTE — Progress Notes (Signed)

## 2023-01-13 DIAGNOSIS — L814 Other melanin hyperpigmentation: Secondary | ICD-10-CM | POA: Diagnosis not present

## 2023-01-13 DIAGNOSIS — L578 Other skin changes due to chronic exposure to nonionizing radiation: Secondary | ICD-10-CM | POA: Diagnosis not present

## 2023-01-13 DIAGNOSIS — D1801 Hemangioma of skin and subcutaneous tissue: Secondary | ICD-10-CM | POA: Diagnosis not present

## 2023-01-13 DIAGNOSIS — D229 Melanocytic nevi, unspecified: Secondary | ICD-10-CM | POA: Diagnosis not present

## 2023-02-25 ENCOUNTER — Other Ambulatory Visit (HOSPITAL_BASED_OUTPATIENT_CLINIC_OR_DEPARTMENT_OTHER): Payer: Self-pay

## 2023-03-25 ENCOUNTER — Other Ambulatory Visit (HOSPITAL_COMMUNITY)
Admission: RE | Admit: 2023-03-25 | Discharge: 2023-03-25 | Disposition: A | Discharge status: Home / Self Care | Payer: 59 | Source: Ambulatory Visit | Attending: Family Medicine | Admitting: Family Medicine

## 2023-03-25 ENCOUNTER — Other Ambulatory Visit (HOSPITAL_BASED_OUTPATIENT_CLINIC_OR_DEPARTMENT_OTHER): Payer: Self-pay

## 2023-03-25 ENCOUNTER — Encounter: Payer: Self-pay | Admitting: Family Medicine

## 2023-03-25 ENCOUNTER — Ambulatory Visit (INDEPENDENT_AMBULATORY_CARE_PROVIDER_SITE_OTHER): Payer: 59 | Admitting: Family Medicine

## 2023-03-25 VITALS — BP 110/70 | HR 94 | Temp 97.6°F | Ht 63.0 in | Wt 180.6 lb

## 2023-03-25 DIAGNOSIS — Z124 Encounter for screening for malignant neoplasm of cervix: Secondary | ICD-10-CM

## 2023-03-25 DIAGNOSIS — N9089 Other specified noninflammatory disorders of vulva and perineum: Secondary | ICD-10-CM

## 2023-03-25 DIAGNOSIS — Z975 Presence of (intrauterine) contraceptive device: Secondary | ICD-10-CM | POA: Diagnosis not present

## 2023-03-25 DIAGNOSIS — J309 Allergic rhinitis, unspecified: Secondary | ICD-10-CM | POA: Diagnosis not present

## 2023-03-25 DIAGNOSIS — F411 Generalized anxiety disorder: Secondary | ICD-10-CM | POA: Diagnosis not present

## 2023-03-25 DIAGNOSIS — Z0000 Encounter for general adult medical examination without abnormal findings: Secondary | ICD-10-CM | POA: Diagnosis not present

## 2023-03-25 DIAGNOSIS — Z13220 Encounter for screening for lipoid disorders: Secondary | ICD-10-CM | POA: Diagnosis not present

## 2023-03-25 LAB — CBC WITH DIFFERENTIAL/PLATELET
Basophils Absolute: 0 10*3/uL (ref 0.0–0.1)
Basophils Relative: 0.8 % (ref 0.0–3.0)
Eosinophils Absolute: 0.2 10*3/uL (ref 0.0–0.7)
Eosinophils Relative: 4.1 % (ref 0.0–5.0)
HCT: 38.5 % (ref 36.0–46.0)
Hemoglobin: 12.3 g/dL (ref 12.0–15.0)
Lymphocytes Relative: 42.1 % (ref 12.0–46.0)
Lymphs Abs: 2.3 10*3/uL (ref 0.7–4.0)
MCHC: 31.9 g/dL (ref 30.0–36.0)
MCV: 83.7 fl (ref 78.0–100.0)
Monocytes Absolute: 0.6 10*3/uL (ref 0.1–1.0)
Monocytes Relative: 10.3 % (ref 3.0–12.0)
Neutro Abs: 2.3 10*3/uL (ref 1.4–7.7)
Neutrophils Relative %: 42.7 % — ABNORMAL LOW (ref 43.0–77.0)
Platelets: 289 10*3/uL (ref 150.0–400.0)
RBC: 4.6 Mil/uL (ref 3.87–5.11)
RDW: 14.5 % (ref 11.5–15.5)
WBC: 5.4 10*3/uL (ref 4.0–10.5)

## 2023-03-25 LAB — COMPREHENSIVE METABOLIC PANEL
ALT: 15 U/L (ref 0–35)
AST: 19 U/L (ref 0–37)
Albumin: 4.4 g/dL (ref 3.5–5.2)
Alkaline Phosphatase: 51 U/L (ref 39–117)
BUN: 14 mg/dL (ref 6–23)
CO2: 23 mEq/L (ref 19–32)
Calcium: 9.5 mg/dL (ref 8.4–10.5)
Chloride: 105 mEq/L (ref 96–112)
Creatinine, Ser: 0.87 mg/dL (ref 0.40–1.20)
GFR: 84.21 mL/min (ref >60.00)
Glucose, Bld: 100 mg/dL — ABNORMAL HIGH (ref 70–99)
Potassium: 4.2 mEq/L (ref 3.5–5.1)
Sodium: 136 mEq/L (ref 135–145)
Total Bilirubin: 0.5 mg/dL (ref 0.2–1.2)
Total Protein: 7.6 g/dL (ref 6.0–8.3)

## 2023-03-25 LAB — LIPID PANEL
Cholesterol: 171 mg/dL (ref 0–200)
HDL: 50.9 mg/dL (ref >39.00)
LDL Cholesterol: 104 mg/dL — ABNORMAL HIGH (ref 0–99)
NonHDL: 119.99
Total CHOL/HDL Ratio: 3
Triglycerides: 79 mg/dL (ref 0.0–149.0)
VLDL: 15.8 mg/dL (ref 0.0–40.0)

## 2023-03-25 LAB — TSH: TSH: 1.66 u[IU]/mL (ref 0.35–5.50)

## 2023-03-25 MED ORDER — MONTELUKAST SODIUM 10 MG PO TABS
10.0000 mg | ORAL_TABLET | Freq: Every day | ORAL | 3 refills | Status: DC
  Filled 2023-03-25: qty 90, 90d supply, fill #0
  Filled 2023-06-18: qty 90, 90d supply, fill #1
  Filled 2023-09-16: qty 90, 90d supply, fill #2
  Filled 2024-02-01: qty 90, 90d supply, fill #3

## 2023-03-25 MED ORDER — SERTRALINE HCL 50 MG PO TABS
50.0000 mg | ORAL_TABLET | Freq: Every evening | ORAL | 3 refills | Status: DC
  Filled 2023-03-25: qty 90, fill #0
  Filled 2023-06-18: qty 90, 90d supply, fill #0
  Filled 2023-09-11 – 2023-09-12 (×2): qty 90, 90d supply, fill #1
  Filled 2024-01-05: qty 90, 90d supply, fill #2

## 2023-03-25 NOTE — Progress Notes (Signed)
Subjective  Chief Complaint  Patient presents with   Annual Exam    Pt here for Annual exam and is not currently fasting    HPI: Sarah Lyons is a 37 y.o. female who presents to [facility] today for a Female Wellness Visit.  She also has the concerns and/or needs as listed above in the chief complaint. These will be addressed in addition to the Health Maintenance Visit.   Wellness Visit: annual visit with health maintenance review and exam with Pap  HM: due for pap.  Reviewed last note.  Had labial lesion, suspicious for HSV.  Had antibody test IgG done which was negative for HSV 1 and 2.  Symptoms resolved.  No prior STDs or similar lesions.  Feels well now. Divorced, not currently in relationship.  IUD in place.  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  Single mother of 2 young children. Chronic disease management visit and/or acute problem visit: Anxiety disorder: Maintained on Zoloft.  Still needed.  Works well.  Increase stress with going back to work.  No panic symptoms no depressive symptoms Chronic perennial allergies.  Has used different oral antihistamines.  Currently on Xyzal.  Needs more help due to chronic symptoms.  No symptoms of infection. He had foot surgery last fall.  Still painful.  Unfortunately has impacted her exercise level.  She has had mild weight gain.  Assessment  1. Annual physical exam   2. IUD (intrauterine device) in place   3. Cervical cancer screening   4. GAD (generalized anxiety disorder)   5. Chronic allergic rhinitis   6. Labial lesion      Plan  Female Wellness Visit: Age appropriate Health Maintenance and Prevention measures were discussed with patient. Included topics are cancer screening recommendations, ways to keep healthy (see AVS) including dietary and exercise recommendations, regular eye and dental care, use of seat belts, and avoidance of moderate alcohol use and tobacco  use.  Pap smear with high-risk HPV done today BMI: discussed patient's BMI and encouraged positive lifestyle modifications to help get to or maintain a target BMI. HM needs and immunizations were addressed and ordered. See below for orders. See HM and immunization section for updates. Routine labs and screening tests ordered including cmp, cbc and lipids where appropriate. Discussed recommendations regarding Vit D and calcium supplementation (see AVS)  Chronic disease f/u and/or acute problem visit: (deemed necessary to be done in addition to the wellness visit): Labial lesion: Clinically consistent with HSV by review of office note.  Explained antibody testing.  Will repeat antibody testing today although discussed risks of false positives and HSV crossover.  Recommend that if patient gets another lesion, she come to the office for PCR testing. Perennial allergies: Continue Xyzal.  Add Flonase if needed.  Start Singulair 10 mg daily. General anxiety disorder doing fairly well on Zoloft 50 mg daily.  Increased work stress due to full-time job and single mother.  Patient to monitor symptoms.  Can adjust dose up if needed.  Follow up: 12 months for complete physical  Orders Placed This Encounter  Procedures   CBC with Differential/Platelet   Comprehensive metabolic panel   Lipid panel   TSH   HSV(herpes simplex vrs) 1+2 ab-IgG   Meds ordered this encounter  Medications   montelukast (SINGULAIR) 10 MG tablet    Sig: Take 1 tablet (10 mg total) by mouth at bedtime.    Dispense:  90 tablet    Refill:  3   sertraline (ZOLOFT) 50 MG tablet    Sig: TAKE 1 TABLET BY MOUTH AT BEDTIME.    Dispense:  90 tablet    Refill:  3       Body mass index is 31.99 kg/m. Wt Readings from Last 3 Encounters:  03/25/23 180 lb 9.6 oz (81.9 kg)  12/24/22 177 lb (80.3 kg)  01/28/22 169 lb (76.7 kg)   Need for contraception: Yes, IUD  Patient Active Problem List   Diagnosis Date Noted   GAD  (generalized anxiety disorder) 03/25/2023    Started with divorce 2020; maintained on zoloft    Acne vulgaris 04/25/2021   IUD (intrauterine device) in place 04/04/2020    Mirena #2, placed 2020 Dr. Billy Coast    Migraine without aura 08/30/2007   Chronic allergic rhinitis 07/30/2007    Qualifier: Diagnosis of  By: Ermalene Searing MD, Amy      Health Maintenance  Topic Date Due   PAP SMEAR-Modifier  11/30/2022   COVID-19 Vaccine (2 - Pfizer risk series) 12/25/2023 (Originally 08/30/2021)   INFLUENZA VACCINE  06/04/2023   DTaP/Tdap/Td (2 - Td or Tdap) 04/26/2031   Hepatitis C Screening  Completed   HIV Screening  Completed   HPV VACCINES  Aged Out   Immunization History  Administered Date(s) Administered   Influenza,inj,Quad PF,6+ Mos 07/08/2016, 08/04/2018, 08/09/2020   Influenza-Unspecified 07/14/2018, 08/01/2019, 08/17/2021, 07/30/2022   Pfizer Covid-19 Vaccine Bivalent Booster 32yrs & up 08/09/2021   Tdap 04/25/2021   We updated and reviewed the patient's past history in detail and it is documented below. Allergies: Patient  reports current alcohol use of about 8.0 standard drinks of alcohol per week. Past Medical History Patient  has a past medical history of Anemia, Anxiety, and Iron deficiency anemia (07/30/2007). Past Surgical History Patient  has a past surgical history that includes Mouth surgery; Cholecystectomy (N/A, 09/05/2015); Breast biopsy (Left, 12/01/2019); and Breast biopsy (Left, 12/01/2019). Social History   Socioeconomic History   Marital status: Divorced    Spouse name: Not on file   Number of children: 2   Years of education: Not on file   Highest education level: Not on file  Occupational History   Occupation: Optometrist: [employer]  Tobacco Use   Smoking status: Never   Smokeless tobacco: Never  Substance and Sexual Activity   Alcohol use: Yes    Alcohol/week: 8.0 standard drinks of alcohol    Types: 5 Glasses of wine, 3 Cans of beer  per week   Drug use: No   Sexual activity: Yes    Birth control/protection: I.U.D.  Other Topics Concern   Not on file  Social History Narrative   From [place] originally   Social Determinants of Health   Financial Resource Strain: Not on file  Food Insecurity: Not on file  Transportation Needs: Not on file  Physical Activity: Not on file  Stress: Not on file  Social Connections: Not on file   Family History  Problem Relation Age of Onset   Breast cancer Paternal Grandmother    Arthritis Paternal Grandmother    Cancer Paternal Grandmother    Obesity Paternal Grandmother    Depression Mother    Anxiety disorder Mother    Hypertension Mother    Obesity Mother    Prostate cancer Father    Hypertension Father    Healthy Daughter    Healthy Son    Birth defects Maternal Grandfather        ?  sarcoma   Cancer Maternal Grandfather    Heart disease Maternal Grandmother    Diabetes Paternal Grandfather    Hearing loss Paternal Grandfather    ADD / ADHD Son    Alcohol abuse Maternal Uncle    Drug abuse Maternal Uncle    Early death Maternal Uncle    Obesity Paternal Aunt    Hyperlipidemia Neg Hx     Review of Systems: Constitutional: negative for fever or malaise Ophthalmic: negative for photophobia, double vision or loss of vision Cardiovascular: negative for chest pain, dyspnea on exertion, or new LE swelling Respiratory: negative for SOB or persistent cough Gastrointestinal: negative for abdominal pain, change in bowel habits or melena Genitourinary: negative for dysuria or gross hematuria, no abnormal uterine bleeding or disharge Musculoskeletal: negative for new gait disturbance or muscular weakness Integumentary: negative for new or persistent rashes, no breast lumps Neurological: negative for TIA or stroke symptoms Psychiatric: negative for SI or delusions Allergic/Immunologic: negative for hives  Patient Care Team    Relationship Specialty Notifications Start End   Willow Ora, MD PCP - General Family Medicine  04/04/20   Olivia Mackie, MD Consulting Physician Obstetrics and Gynecology  05/17/18   Willeen Niece, MD  Dermatology  04/25/21     Objective  Vitals: BP 110/70   Pulse 94   Temp 97.6 F (36.4 C)   Ht 5\' 3"  (1.6 m)   Wt 180 lb 9.6 oz (81.9 kg)   SpO2 96%   BMI 31.99 kg/m  General:  Well developed, well nourished, no acute distress  Psych:  Alert and orientedx3,normal mood and affect HEENT:  Normocephalic, atraumatic, non-icteric sclera, PERRL, supple neck without adenopathy, mass or thyromegaly Cardiovascular:  Normal S1, S2, RRR without gallop, rub or murmur Respiratory:  Good breath sounds bilaterally, CTAB with normal respiratory effort Gastrointestinal: normal bowel sounds, soft, non-tender, no noted masses. No HSM MSK: no deformities, contusions. Joints are without erythema or swelling.  Skin:  Warm, no rashes or suspicious lesions noted Neurologic:    Mental status is normal. Gross motor and sensory exams are normal. Normal gait. No tremor Pelvic Exam: Normal external genitalia, no vulvar or vaginal lesions present. Clear cervix w/o CMT. Bimanual exam reveals a nontender fundus w/o masses, nl size. No adnexal masses present. No inguinal adenopathy. A PAP smear was performed.    Commons side effects, risks, benefits, and alternatives for medications and treatment plan prescribed today were discussed, and the patient expressed understanding of the given instructions. Patient is instructed to call or message via MyChart if he/she has any questions or concerns regarding our treatment plan. No barriers to understanding were identified. We discussed Red Flag symptoms and signs in detail. Patient expressed understanding regarding what to do in case of urgent or emergency type symptoms.  Medication list was reconciled, printed and provided to the patient in AVS. Patient instructions and summary information was reviewed with the patient as  documented in the AVS. This note was prepared with assistance of Dragon voice recognition software. Occasional wrong-word or sound-a-like substitutions may have occurred due to the inherent limitations of voice recognition software .

## 2023-03-25 NOTE — Patient Instructions (Signed)

## 2023-03-27 LAB — HSV(HERPES SIMPLEX VRS) I + II AB-IGG
HAV 1 IGG,TYPE SPECIFIC AB: <0.9 index
HSV 2 IGG,TYPE SPECIFIC AB: 3.61 index — ABNORMAL HIGH

## 2023-03-27 NOTE — Progress Notes (Signed)
Labs reviewed.  The ASCVD Risk score (Arnett DK, et al., 2019) failed to calculate for the following reasons:   The 2019 ASCVD risk score is only valid for ages 38 to 22

## 2023-03-31 ENCOUNTER — Encounter: Payer: Self-pay | Admitting: Family Medicine

## 2023-03-31 DIAGNOSIS — R7689 Other specified abnormal immunological findings in serum: Secondary | ICD-10-CM | POA: Insufficient documentation

## 2023-03-31 DIAGNOSIS — R768 Other specified abnormal immunological findings in serum: Secondary | ICD-10-CM | POA: Insufficient documentation

## 2023-03-31 LAB — CYTOLOGY - PAP
Comment: NEGATIVE
Diagnosis: NEGATIVE
High risk HPV: NEGATIVE

## 2023-03-31 NOTE — Progress Notes (Signed)
See mychart note Dear Ms. Custis, Your antibody test for HSV-2 has now returned positive.  This does likely indicate that you have acquired immunity to herpes simplex virus type II.  This would correlate to that lesion being herpetic.  Let me know if you have any further outbreaks or questions. Sincerely, Dr. Mardelle Matte

## 2023-04-01 NOTE — Progress Notes (Signed)
Nl pap and neg HR HPV screen; mychart note sent. Repeat in 5 years.

## 2023-04-10 ENCOUNTER — Encounter: Payer: Self-pay | Admitting: Family Medicine

## 2023-04-10 ENCOUNTER — Other Ambulatory Visit (HOSPITAL_BASED_OUTPATIENT_CLINIC_OR_DEPARTMENT_OTHER): Payer: Self-pay

## 2023-04-10 MED ORDER — VALACYCLOVIR HCL 1 G PO TABS: 1000.0000 mg | ORAL_TABLET | Freq: Two times a day (BID) | ORAL | 0 refills | Status: AC

## 2023-04-10 NOTE — Telephone Encounter (Signed)
Please see patient msg and advise if ok to send for patient

## 2023-06-18 ENCOUNTER — Other Ambulatory Visit: Payer: Self-pay

## 2023-06-18 ENCOUNTER — Other Ambulatory Visit (HOSPITAL_BASED_OUTPATIENT_CLINIC_OR_DEPARTMENT_OTHER): Payer: Self-pay

## 2023-07-22 ENCOUNTER — Other Ambulatory Visit: Payer: Self-pay | Admitting: Family Medicine

## 2023-07-22 ENCOUNTER — Other Ambulatory Visit (HOSPITAL_BASED_OUTPATIENT_CLINIC_OR_DEPARTMENT_OTHER): Payer: Self-pay

## 2023-07-22 MED ORDER — VALACYCLOVIR HCL 1 G PO TABS
ORAL_TABLET | ORAL | 0 refills | Status: DC
  Filled 2023-07-22: qty 30, 15d supply, fill #0

## 2023-08-12 DIAGNOSIS — F4323 Adjustment disorder with mixed anxiety and depressed mood: Secondary | ICD-10-CM | POA: Diagnosis not present

## 2023-08-27 ENCOUNTER — Ambulatory Visit (INDEPENDENT_AMBULATORY_CARE_PROVIDER_SITE_OTHER): Payer: 59 | Admitting: Podiatry

## 2023-08-27 ENCOUNTER — Encounter: Payer: Self-pay | Admitting: Podiatry

## 2023-08-27 DIAGNOSIS — D2372 Other benign neoplasm of skin of left lower limb, including hip: Secondary | ICD-10-CM

## 2023-08-27 NOTE — Progress Notes (Signed)
Subjective:  Patient ID: Sarah Lyons, female    DOB: April 16, 1986,   MRN: [id number]  No chief complaint on file.   37 y.o. female presents for concern of a lesion on her left great toe that has been there for a while. Relates it has been causing pain. Relates more pain with direct pressure than when squeezing the lesion. She has tried some salycylic acid pads on the area but have been getting more of the outer skin irritated than helping.  . Denies any other pedal complaints. Denies n/v/f/c.   Past Medical History:  Diagnosis Date   Anemia    hx   Anxiety    Iron deficiency anemia 07/30/2007    Objective:  Physical Exam: Vascular: DP/PT pulses 2/4 bilateral. CFT <3 seconds. Normal hair growth on digits. No edema.  Skin. No lacerations or abrasions bilateral feet. Hyperkeratotic cored lesion noted to plantar left hallux pad. Disruption of skin lines noted.  Musculoskeletal: MMT 5/5 bilateral lower extremities in DF, PF, Inversion and Eversion. Deceased ROM in DF of ankle joint.  Neurological: Sensation intact to light touch.   Assessment:   1. Benign neoplasm of skin of left foot      Plan:  Patient was evaluated and treated and all questions answered. -Discussed benign skin lesions with patient and treatment options.  -Hyperkeratotic tissue was debrided with chisel without incident.  -Applied salycylic acid treatment to area with dressing. Advised to remove bandaging tomorrow.  -Encouraged daily moisturizing -Discussed use of pumice stone -Advised good supportive shoes and inserts -Patient to return to office as needed or sooner if condition worsens.   Louann Sjogren, DPM

## 2023-09-09 ENCOUNTER — Other Ambulatory Visit (HOSPITAL_BASED_OUTPATIENT_CLINIC_OR_DEPARTMENT_OTHER): Payer: Self-pay

## 2023-09-09 ENCOUNTER — Other Ambulatory Visit: Payer: Self-pay | Admitting: Family Medicine

## 2023-09-09 ENCOUNTER — Encounter: Payer: Self-pay | Admitting: Podiatry

## 2023-09-09 MED ORDER — SPIRONOLACTONE 50 MG PO TABS
100.0000 mg | ORAL_TABLET | Freq: Every day | ORAL | 3 refills | Status: DC
  Filled 2023-09-09: qty 180, 90d supply, fill #0
  Filled 2024-01-05: qty 180, 90d supply, fill #1
  Filled 2024-04-13: qty 180, 90d supply, fill #2
  Filled 2024-04-26 – 2024-07-19 (×2): qty 180, 90d supply, fill #3

## 2023-09-12 ENCOUNTER — Other Ambulatory Visit (HOSPITAL_BASED_OUTPATIENT_CLINIC_OR_DEPARTMENT_OTHER): Payer: Self-pay

## 2023-09-23 DIAGNOSIS — L710 Perioral dermatitis: Secondary | ICD-10-CM | POA: Diagnosis not present

## 2023-09-23 DIAGNOSIS — B078 Other viral warts: Secondary | ICD-10-CM | POA: Diagnosis not present

## 2023-09-23 DIAGNOSIS — B079 Viral wart, unspecified: Secondary | ICD-10-CM | POA: Diagnosis not present

## 2023-10-23 ENCOUNTER — Other Ambulatory Visit (HOSPITAL_BASED_OUTPATIENT_CLINIC_OR_DEPARTMENT_OTHER): Payer: Self-pay

## 2023-10-23 MED ORDER — NITROFURANTOIN MONOHYD MACRO 100 MG PO CAPS
100.0000 mg | ORAL_CAPSULE | Freq: Two times a day (BID) | ORAL | 0 refills | Status: AC
  Filled 2023-10-23: qty 10, 5d supply, fill #0

## 2023-11-10 DIAGNOSIS — B078 Other viral warts: Secondary | ICD-10-CM | POA: Diagnosis not present

## 2023-11-20 ENCOUNTER — Ambulatory Visit: Payer: BC Managed Care – PPO | Admitting: Family Medicine

## 2023-11-20 ENCOUNTER — Ambulatory Visit: Payer: Self-pay | Admitting: Physician Assistant

## 2023-11-20 ENCOUNTER — Other Ambulatory Visit (HOSPITAL_BASED_OUTPATIENT_CLINIC_OR_DEPARTMENT_OTHER): Payer: Self-pay

## 2023-11-20 ENCOUNTER — Encounter: Payer: Self-pay | Admitting: Family Medicine

## 2023-11-20 VITALS — BP 114/71 | HR 95 | Temp 97.6°F | Resp 18 | Ht 63.0 in | Wt 177.4 lb

## 2023-11-20 DIAGNOSIS — J101 Influenza due to other identified influenza virus with other respiratory manifestations: Secondary | ICD-10-CM | POA: Diagnosis not present

## 2023-11-20 DIAGNOSIS — R52 Pain, unspecified: Secondary | ICD-10-CM

## 2023-11-20 DIAGNOSIS — R059 Cough, unspecified: Secondary | ICD-10-CM | POA: Diagnosis not present

## 2023-11-20 DIAGNOSIS — R519 Headache, unspecified: Secondary | ICD-10-CM | POA: Diagnosis not present

## 2023-11-20 LAB — POCT INFLUENZA A/B
Influenza A, POC: POSITIVE — AB
Influenza B, POC: NEGATIVE

## 2023-11-20 LAB — POC COVID19 BINAXNOW: SARS Coronavirus 2 Ag: NEGATIVE

## 2023-11-20 MED ORDER — OSELTAMIVIR PHOSPHATE 75 MG PO CAPS
75.0000 mg | ORAL_CAPSULE | Freq: Two times a day (BID) | ORAL | 0 refills | Status: DC
  Filled 2023-11-20: qty 10, 5d supply, fill #0

## 2023-11-20 MED ORDER — BENZONATATE 100 MG PO CAPS
100.0000 mg | ORAL_CAPSULE | Freq: Three times a day (TID) | ORAL | 0 refills | Status: DC | PRN
  Filled 2023-11-20: qty 20, 7d supply, fill #0

## 2023-11-20 NOTE — Patient Instructions (Signed)
Symptomatic tx

## 2023-11-20 NOTE — Progress Notes (Signed)
Subjective:     Patient ID: Sarah Lyons, female    DOB: April 05, 1986, 38 y.o.   MRN: [id number]  Chief Complaint  Patient presents with   Cough    Productive cough Sx started Tuesday night Taking Nyquil, Dayquil and using cough drops   Sinusitis    Sinus pressure and headache   Nasal Congestion   Generalized Body Aches    HPI Discussed the use of AI scribe software for clinical note transcription with the patient, who gave verbal consent to proceed.  History of Present Illness   The patient, with a history of allergies, presented with symptoms that began on Tuesday night with a persistent cough. By Wednesday, the patient reported the onset of a sinus headache, body aches, and extreme fatigue. Despite feeling hot and cold intermittently, the patient denied having a fever. The patient had tested for COVID-19 at home the day before the consultation, which was negative. The patient reported no sore throat, except for minor discomfort due to coughing. The patient had received a flu shot prior to the onset of symptoms. The patient denied any symptoms of gastrointestinal upset such as vomiting or diarrhea. The patient has no history of asthma but has a known allergy to sulfas. The patient's daughter had pneumonia in November, but the patient does not believe the current illness is related to that exposure.       There are no preventive care reminders to display for this patient.   Past Medical History:  Diagnosis Date   Anemia    hx   Anxiety    Iron deficiency anemia 07/30/2007    Past Surgical History:  Procedure Laterality Date   BREAST BIOPSY Left 12/01/2019   affirm bx calcs ribbon marker, path pending   BREAST BIOPSY Left 12/01/2019   [place] bx mass heart marker path pending   CHOLECYSTECTOMY N/A 09/05/2015   Procedure: LAPAROSCOPIC CHOLECYSTECTOMY WITH INTRAOPERATIVE CHOLANGIOGRAM;  Surgeon: Chevis Pretty III, MD;  Location: [facility] OR;  Service: General;  Laterality: N/A;   MOUTH  SURGERY     teeth      Current Outpatient Medications:    benzonatate (TESSALON PERLES) 100 MG capsule, Take 1 capsule (100 mg total) by mouth 3 (three) times daily as needed., Disp: 20 capsule, Rfl: 0   levonorgestrel (MIRENA) 20 MCG/24HR IUD, 1 each by Intrauterine route once., Disp: , Rfl:    montelukast (SINGULAIR) 10 MG tablet, Take 1 tablet (10 mg total) by mouth at bedtime., Disp: 90 tablet, Rfl: 3   oseltamivir (TAMIFLU) 75 MG capsule, Take 1 capsule (75 mg total) by mouth 2 (two) times daily., Disp: 10 capsule, Rfl: 0   sertraline (ZOLOFT) 50 MG tablet, Take 1 tablet (50 mg total) by mouth at bedtime., Disp: 90 tablet, Rfl: 3   spironolactone (ALDACTONE) 50 MG tablet, Take 2 tablets (100 mg total) by mouth daily., Disp: 180 tablet, Rfl: 3   valACYclovir (VALTREX) 1000 MG tablet, Take 1 tablet by mouth twice daily for 10 days for first outbreak. If subsequent episode, take 1 tablet twice daily for 3 days., Disp: 30 tablet, Rfl: 0  Allergies  Allergen Reactions   Sulfonamide Derivatives Other (See Comments)   ROS neg/noncontributory except as noted HPI/below      Objective:     BP 114/71   Pulse 95   Temp 97.6 F (36.4 C) (Temporal)   Resp 18   Ht 5\' 3"  (1.6 m)   Wt 177 lb 6 oz (80.5 kg)  SpO2 96%   BMI 31.42 kg/m  Wt Readings from Last 3 Encounters:  11/20/23 177 lb 6 oz (80.5 kg)  03/25/23 180 lb 9.6 oz (81.9 kg)  12/24/22 177 lb (80.3 kg)    Physical Exam   Gen: WDWN NAD.  Mild/mod ill appearing HEENT: NCAT, conjunctiva not injected, sclera nonicteric TM WNL B, OP moist, no exudates  NECK:  supple, no thyromegaly, no nodes, no carotid bruits CARDIAC: RRR, S1S2+, no murmur. LUNGS: CTAB. No wheezes MSK: no gross abnormalities.  NEURO: A&O x3.  CN II-XII intact.  PSYCH: normal mood. Good eye contact  Results for orders placed or performed in visit on 11/20/23  POC COVID-19   Collection Time: 11/20/23  3:36 PM  Result Value Ref Range   SARS Coronavirus  2 Ag Negative Negative  POCT Influenza A/B   Collection Time: 11/20/23  3:37 PM  Result Value Ref Range   Influenza A, POC Positive (A) Negative   Influenza B, POC Negative Negative        Assessment & Plan:  Influenza A  Cough, unspecified type -     POC COVID-19 BinaxNow -     POCT Influenza A/B  Nonintractable headache, unspecified chronicity pattern, unspecified headache type -     POC COVID-19 BinaxNow -     POCT Influenza A/B  Body aches -     POC COVID-19 BinaxNow -     POCT Influenza A/B  Other orders -     Oseltamivir Phosphate; Take 1 capsule (75 mg total) by mouth 2 (two) times daily.  Dispense: 10 capsule; Refill: 0 -     Benzonatate; Take 1 capsule (100 mg total) by mouth 3 (three) times daily as needed.  Dispense: 20 capsule; Refill: 0   Influenza A-tamiflu 75mg  bid.  Symptomatic tx.   Assessment and Plan    Influenza A Symptoms began acutely on Tuesday night with a throat tickle, progressing to a cough by Wednesday, followed by sinus headache, body aches, and exhaustion by Wednesday night. No fever, but experiences warmth and chills. COVID test negative; Influenza A confirmed. Tamiflu discussed for reducing symptom duration if started early, with potential nausea as a side effect. Prefers to start Tamiflu. Advised to stay home and avoid contact with others until symptoms resolve, likely through the weekend. Symptomatic treatment options and hydration importance discussed. Tessalon Perles offered for cough management. Prescribe Tamiflu 75 mg twice daily. Recommend symptomatic treatment with acetaminophen, ibuprofen, or combination cold and flu medications. Encourage adequate hydration. Prescribe Tessalon Perles for cough management.  General Health Maintenance Received a flu shot this season. Emphasized maintaining hygiene to prevent spreading influenza to family members. Advised to monitor family members for symptoms and seek medical advice if symptoms develop.  Recommend wiping down surfaces and maintaining hygiene to prevent infection spread.        Return if symptoms worsen or fail to improve.  Angelena Sole, MD

## 2023-12-02 ENCOUNTER — Other Ambulatory Visit (HOSPITAL_BASED_OUTPATIENT_CLINIC_OR_DEPARTMENT_OTHER): Payer: Self-pay

## 2023-12-02 DIAGNOSIS — Z124 Encounter for screening for malignant neoplasm of cervix: Secondary | ICD-10-CM | POA: Diagnosis not present

## 2023-12-02 DIAGNOSIS — Z1331 Encounter for screening for depression: Secondary | ICD-10-CM | POA: Diagnosis not present

## 2023-12-02 DIAGNOSIS — Z01419 Encounter for gynecological examination (general) (routine) without abnormal findings: Secondary | ICD-10-CM | POA: Diagnosis not present

## 2023-12-02 MED ORDER — VALACYCLOVIR HCL 500 MG PO TABS
500.0000 mg | ORAL_TABLET | Freq: Every day | ORAL | 3 refills | Status: AC
  Filled 2023-12-02: qty 90, 90d supply, fill #0
  Filled 2024-03-02: qty 90, 90d supply, fill #1
  Filled 2024-05-31: qty 90, 90d supply, fill #2
  Filled 2024-09-12: qty 90, 90d supply, fill #3

## 2023-12-08 ENCOUNTER — Other Ambulatory Visit (HOSPITAL_BASED_OUTPATIENT_CLINIC_OR_DEPARTMENT_OTHER): Payer: Self-pay

## 2024-01-05 ENCOUNTER — Other Ambulatory Visit (HOSPITAL_BASED_OUTPATIENT_CLINIC_OR_DEPARTMENT_OTHER): Payer: Self-pay

## 2024-01-07 ENCOUNTER — Other Ambulatory Visit (HOSPITAL_BASED_OUTPATIENT_CLINIC_OR_DEPARTMENT_OTHER): Payer: Self-pay

## 2024-02-08 ENCOUNTER — Other Ambulatory Visit (HOSPITAL_BASED_OUTPATIENT_CLINIC_OR_DEPARTMENT_OTHER): Payer: Self-pay

## 2024-02-09 DIAGNOSIS — D229 Melanocytic nevi, unspecified: Secondary | ICD-10-CM | POA: Diagnosis not present

## 2024-02-09 DIAGNOSIS — L821 Other seborrheic keratosis: Secondary | ICD-10-CM | POA: Diagnosis not present

## 2024-02-09 DIAGNOSIS — L578 Other skin changes due to chronic exposure to nonionizing radiation: Secondary | ICD-10-CM | POA: Diagnosis not present

## 2024-02-09 DIAGNOSIS — L814 Other melanin hyperpigmentation: Secondary | ICD-10-CM | POA: Diagnosis not present

## 2024-02-09 DIAGNOSIS — L820 Inflamed seborrheic keratosis: Secondary | ICD-10-CM | POA: Diagnosis not present

## 2024-03-02 ENCOUNTER — Other Ambulatory Visit (HOSPITAL_BASED_OUTPATIENT_CLINIC_OR_DEPARTMENT_OTHER): Payer: Self-pay

## 2024-04-13 ENCOUNTER — Other Ambulatory Visit: Payer: Self-pay | Admitting: Family Medicine

## 2024-04-13 DIAGNOSIS — F411 Generalized anxiety disorder: Secondary | ICD-10-CM

## 2024-04-14 ENCOUNTER — Other Ambulatory Visit: Payer: Self-pay

## 2024-04-14 ENCOUNTER — Other Ambulatory Visit (HOSPITAL_BASED_OUTPATIENT_CLINIC_OR_DEPARTMENT_OTHER): Payer: Self-pay

## 2024-04-14 MED ORDER — SERTRALINE HCL 50 MG PO TABS
50.0000 mg | ORAL_TABLET | Freq: Every evening | ORAL | 0 refills | Status: DC
Start: 2024-04-14 — End: 2024-05-15
  Filled 2024-04-14: qty 30, 30d supply, fill #0

## 2024-04-15 ENCOUNTER — Other Ambulatory Visit (HOSPITAL_BASED_OUTPATIENT_CLINIC_OR_DEPARTMENT_OTHER): Payer: Self-pay

## 2024-04-26 ENCOUNTER — Other Ambulatory Visit (HOSPITAL_BASED_OUTPATIENT_CLINIC_OR_DEPARTMENT_OTHER): Payer: Self-pay

## 2024-05-15 ENCOUNTER — Other Ambulatory Visit: Payer: Self-pay | Admitting: Family Medicine

## 2024-05-15 DIAGNOSIS — F411 Generalized anxiety disorder: Secondary | ICD-10-CM

## 2024-05-16 ENCOUNTER — Other Ambulatory Visit (HOSPITAL_BASED_OUTPATIENT_CLINIC_OR_DEPARTMENT_OTHER): Payer: Self-pay

## 2024-05-16 MED ORDER — SERTRALINE HCL 50 MG PO TABS
50.0000 mg | ORAL_TABLET | Freq: Every evening | ORAL | 0 refills | Status: DC
Start: 2024-05-16 — End: 2024-06-15
  Filled 2024-05-16: qty 30, 30d supply, fill #0

## 2024-05-16 MED ORDER — MONTELUKAST SODIUM 10 MG PO TABS
10.0000 mg | ORAL_TABLET | Freq: Every day | ORAL | 3 refills | Status: AC
  Filled 2024-05-16: qty 90, 90d supply, fill #0
  Filled 2024-05-31 – 2024-08-16 (×2): qty 90, 90d supply, fill #1
  Filled 2024-11-15: qty 90, 90d supply, fill #2

## 2024-05-17 ENCOUNTER — Other Ambulatory Visit (HOSPITAL_BASED_OUTPATIENT_CLINIC_OR_DEPARTMENT_OTHER): Payer: Self-pay | Admitting: Family Medicine

## 2024-05-17 DIAGNOSIS — Z1231 Encounter for screening mammogram for malignant neoplasm of breast: Secondary | ICD-10-CM

## 2024-05-20 ENCOUNTER — Ambulatory Visit (HOSPITAL_BASED_OUTPATIENT_CLINIC_OR_DEPARTMENT_OTHER)
Admission: RE | Admit: 2024-05-20 | Discharge: 2024-05-20 | Disposition: A | Discharge status: Home / Self Care | Source: Ambulatory Visit

## 2024-05-20 ENCOUNTER — Encounter (HOSPITAL_BASED_OUTPATIENT_CLINIC_OR_DEPARTMENT_OTHER): Payer: Self-pay | Admitting: Radiology

## 2024-05-20 DIAGNOSIS — Z1231 Encounter for screening mammogram for malignant neoplasm of breast: Secondary | ICD-10-CM | POA: Insufficient documentation

## 2024-05-31 ENCOUNTER — Other Ambulatory Visit (HOSPITAL_BASED_OUTPATIENT_CLINIC_OR_DEPARTMENT_OTHER): Payer: Self-pay

## 2024-05-31 ENCOUNTER — Other Ambulatory Visit: Payer: Self-pay

## 2024-06-15 ENCOUNTER — Other Ambulatory Visit: Payer: Self-pay | Admitting: Family Medicine

## 2024-06-15 DIAGNOSIS — F411 Generalized anxiety disorder: Secondary | ICD-10-CM

## 2024-06-16 ENCOUNTER — Other Ambulatory Visit (HOSPITAL_BASED_OUTPATIENT_CLINIC_OR_DEPARTMENT_OTHER): Payer: Self-pay

## 2024-06-16 MED ORDER — SERTRALINE HCL 50 MG PO TABS
50.0000 mg | ORAL_TABLET | Freq: Every evening | ORAL | 0 refills | Status: DC
  Filled 2024-06-16: qty 30, 30d supply, fill #0

## 2024-07-19 ENCOUNTER — Other Ambulatory Visit (HOSPITAL_BASED_OUTPATIENT_CLINIC_OR_DEPARTMENT_OTHER): Payer: Self-pay

## 2024-07-19 ENCOUNTER — Other Ambulatory Visit: Payer: Self-pay | Admitting: Family Medicine

## 2024-07-19 ENCOUNTER — Other Ambulatory Visit: Payer: Self-pay

## 2024-07-19 DIAGNOSIS — F411 Generalized anxiety disorder: Secondary | ICD-10-CM

## 2024-07-19 MED ORDER — SERTRALINE HCL 50 MG PO TABS
50.0000 mg | ORAL_TABLET | Freq: Every evening | ORAL | 0 refills | Status: DC
Start: 2024-07-19 — End: 2024-08-16
  Filled 2024-07-19: qty 30, 30d supply, fill #0

## 2024-07-21 ENCOUNTER — Ambulatory Visit (HOSPITAL_BASED_OUTPATIENT_CLINIC_OR_DEPARTMENT_OTHER)

## 2024-07-21 ENCOUNTER — Ambulatory Visit (INDEPENDENT_AMBULATORY_CARE_PROVIDER_SITE_OTHER): Admitting: Student

## 2024-07-21 DIAGNOSIS — M25511 Pain in right shoulder: Secondary | ICD-10-CM

## 2024-07-21 NOTE — Progress Notes (Signed)
 Chief Complaint: Right shoulder pain     History of Present Illness:    Sarah Lyons is a 38 y.o. right-hand-dominant female who presents to clinic today for evaluation of right shoulder pain.  This began last night when she reached her right arm to cut off a light switch and experienced a sharp shooting pain within the shoulder.  Pain is more with movement than at rest she rates this as more mild today as it has improved some this morning.  Denies any weakness or loss of range of motion.  She works a job mostly using a Animator.   Surgical History:   None  PMH/PSH/Family History/Social History/Meds/Allergies:    Past Medical History:  Diagnosis Date   Anemia    hx   Anxiety    Iron deficiency anemia 07/30/2007   Past Surgical History:  Procedure Laterality Date   BREAST BIOPSY Left 12/01/2019   affirm bx calcs ribbon marker, path pending   BREAST BIOPSY Left 12/01/2019   us  bx mass heart marker path pending   CHOLECYSTECTOMY N/A 09/05/2015   Procedure: LAPAROSCOPIC CHOLECYSTECTOMY WITH INTRAOPERATIVE CHOLANGIOGRAM;  Surgeon: Deward Null III, MD;  Location: [facility] OR;  Service: General;  Laterality: N/A;   MOUTH SURGERY     teeth    Social History   Socioeconomic History   Marital status: Divorced    Spouse name: Not on file   Number of children: 2   Years of education: Not on file   Highest education level: Not on file  Occupational History   Occupation: Presenter, broadcasting    Comment: [employer]  Tobacco Use   Smoking status: Never   Smokeless tobacco: Never  Substance and Sexual Activity   Alcohol use: Yes    Alcohol/week: 8.0 standard drinks of alcohol    Types: 5 Glasses of wine, 3 Cans of beer per week   Drug use: No   Sexual activity: Yes    Birth control/protection: I.U.D.  Other Topics Concern   Not on file  Social History Narrative   From [place] originally   Social Drivers of Corporate investment banker Strain: Not on  file  Food Insecurity: Not on file  Transportation Needs: Not on file  Physical Activity: Not on file  Stress: Not on file  Social Connections: Not on file   Family History  Problem Relation Age of Onset   Breast cancer Paternal Grandmother    Arthritis Paternal Grandmother    Cancer Paternal Grandmother    Obesity Paternal Grandmother    Depression Mother    Anxiety disorder Mother    Hypertension Mother    Obesity Mother    Prostate cancer Father    Hypertension Father    Healthy Daughter    Healthy Son    Birth defects Maternal Grandfather        ? sarcoma   Cancer Maternal Grandfather    Heart disease Maternal Grandmother    Diabetes Paternal Grandfather    Hearing loss Paternal Grandfather    ADD / ADHD Son    Alcohol abuse Maternal Uncle    Drug abuse Maternal Uncle    Early death Maternal Uncle    Obesity Paternal Aunt    Hyperlipidemia Neg Hx    Allergies  Allergen Reactions   Sulfonamide Derivatives Other (  See Comments)   Current Outpatient Medications  Medication Sig Dispense Refill   benzonatate  (TESSALON  PERLES) 100 MG capsule Take 1 capsule (100 mg total) by mouth 3 (three) times daily as needed. 20 capsule 0   levonorgestrel  (MIRENA ) 20 MCG/24HR IUD 1 each by Intrauterine route once.     montelukast  (SINGULAIR ) 10 MG tablet Take 1 tablet (10 mg total) by mouth at bedtime. 90 tablet 3   oseltamivir  (TAMIFLU ) 75 MG capsule Take 1 capsule (75 mg total) by mouth 2 (two) times daily. 10 capsule 0   sertraline  (ZOLOFT ) 50 MG tablet Take 1 tablet (50 mg total) by mouth at bedtime. Need OV for future refills 30 tablet 0   spironolactone  (ALDACTONE ) 50 MG tablet Take 2 tablets (100 mg total) by mouth daily. 180 tablet 3   valACYclovir  (VALTREX ) 1000 MG tablet Take 1 tablet by mouth twice daily for 10 days for first outbreak. If subsequent episode, take 1 tablet twice daily for 3 days. 30 tablet 0   valACYclovir  (VALTREX ) 500 MG tablet Take 1 tablet (500 mg total)  by mouth daily. 90 tablet 3   No current facility-administered medications for this visit.   No results found.  Review of Systems:   A ROS was performed including pertinent positives and negatives as documented in the HPI.  Physical Exam :   Constitutional: NAD and appears stated age Neurological: Alert and oriented Psych: Appropriate affect and cooperative There were no vitals taken for this visit.   Comprehensive Musculoskeletal Exam:    Exam of the right shoulder demonstrates mild tenderness palpation of the lateral deltoid but otherwise is grossly nontender.  Full active range of motion to 160 degrees forward flexion, 40 degrees external rotation, and internal rotation to L2 bilaterally.  Negative Neer, Hawkins, and empty can.  Imaging:   Xray (right shoulder 3 views): Negative for bony abnormality   I personally reviewed and interpreted the radiographs.   Assessment:   38 y.o. female with acute right shoulder pain that began suddenly last night when reaching for a light switch.  X-rays today are well-appearing and on exam she does demonstrate full range of motion and good strength with rotator cuff testing which is reassuring.  Her symptoms have improved some since she woke up this morning without use of medication.  Based on her symptoms I would have suspicion for a mild rotator cuff strain versus tendinitis so I will recommend a few days of rest as well as NSAIDs.  Advised that if symptoms worsen or do not improve, we can always follow-up for a repeat assessment and to discuss other treatment options.  Plan :    - Return to clinic as needed     I personally saw and evaluated the patient, and participated in the management and treatment plan.  Leonce Reveal, PA-C Orthopedics

## 2024-08-16 ENCOUNTER — Other Ambulatory Visit: Payer: Self-pay

## 2024-08-16 ENCOUNTER — Other Ambulatory Visit: Payer: Self-pay | Admitting: Family Medicine

## 2024-08-16 ENCOUNTER — Other Ambulatory Visit (HOSPITAL_BASED_OUTPATIENT_CLINIC_OR_DEPARTMENT_OTHER): Payer: Self-pay

## 2024-08-16 ENCOUNTER — Encounter: Payer: Self-pay | Admitting: Family Medicine

## 2024-08-16 ENCOUNTER — Ambulatory Visit (INDEPENDENT_AMBULATORY_CARE_PROVIDER_SITE_OTHER): Admitting: Family Medicine

## 2024-08-16 VITALS — BP 110/70 | HR 73 | Temp 98.2°F | Ht 63.0 in | Wt 191.4 lb

## 2024-08-16 DIAGNOSIS — Z23 Encounter for immunization: Secondary | ICD-10-CM | POA: Diagnosis not present

## 2024-08-16 DIAGNOSIS — J309 Allergic rhinitis, unspecified: Secondary | ICD-10-CM

## 2024-08-16 DIAGNOSIS — F411 Generalized anxiety disorder: Secondary | ICD-10-CM | POA: Diagnosis not present

## 2024-08-16 DIAGNOSIS — E669 Obesity, unspecified: Secondary | ICD-10-CM

## 2024-08-16 DIAGNOSIS — Z13220 Encounter for screening for lipoid disorders: Secondary | ICD-10-CM | POA: Diagnosis not present

## 2024-08-16 DIAGNOSIS — Z6833 Body mass index (BMI) 33.0-33.9, adult: Secondary | ICD-10-CM

## 2024-08-16 DIAGNOSIS — G43009 Migraine without aura, not intractable, without status migrainosus: Secondary | ICD-10-CM

## 2024-08-16 DIAGNOSIS — N951 Menopausal and female climacteric states: Secondary | ICD-10-CM | POA: Diagnosis not present

## 2024-08-16 DIAGNOSIS — Z0001 Encounter for general adult medical examination with abnormal findings: Secondary | ICD-10-CM | POA: Diagnosis not present

## 2024-08-16 DIAGNOSIS — L700 Acne vulgaris: Secondary | ICD-10-CM

## 2024-08-16 DIAGNOSIS — Z975 Presence of (intrauterine) contraceptive device: Secondary | ICD-10-CM

## 2024-08-16 LAB — COMPREHENSIVE METABOLIC PANEL WITH GFR
ALT: 17 U/L (ref 0–35)
AST: 20 U/L (ref 0–37)
Albumin: 4.3 g/dL (ref 3.5–5.2)
Alkaline Phosphatase: 43 U/L (ref 39–117)
BUN: 11 mg/dL (ref 6–23)
CO2: 25 meq/L (ref 19–32)
Calcium: 9 mg/dL (ref 8.4–10.5)
Chloride: 104 meq/L (ref 96–112)
Creatinine, Ser: 0.84 mg/dL (ref 0.40–1.20)
GFR: 86.97 mL/min (ref >60.00)
Glucose, Bld: 102 mg/dL — ABNORMAL HIGH (ref 70–99)
Potassium: 4.3 meq/L (ref 3.5–5.1)
Sodium: 135 meq/L (ref 135–145)
Total Bilirubin: 0.6 mg/dL (ref 0.2–1.2)
Total Protein: 7.4 g/dL (ref 6.0–8.3)

## 2024-08-16 LAB — TSH: TSH: 2.01 u[IU]/mL (ref 0.35–5.50)

## 2024-08-16 LAB — CBC WITH DIFFERENTIAL/PLATELET
Basophils Absolute: 0 K/uL (ref 0.0–0.1)
Basophils Relative: 0.5 % (ref 0.0–3.0)
Eosinophils Absolute: 0.1 K/uL (ref 0.0–0.7)
Eosinophils Relative: 2.3 % (ref 0.0–5.0)
HCT: 39.2 % (ref 36.0–46.0)
Hemoglobin: 13.1 g/dL (ref 12.0–15.0)
Lymphocytes Relative: 40 % (ref 12.0–46.0)
Lymphs Abs: 2.2 K/uL (ref 0.7–4.0)
MCHC: 33.3 g/dL (ref 30.0–36.0)
MCV: 89.8 fl (ref 78.0–100.0)
Monocytes Absolute: 0.5 K/uL (ref 0.1–1.0)
Monocytes Relative: 8.4 % (ref 3.0–12.0)
Neutro Abs: 2.7 K/uL (ref 1.4–7.7)
Neutrophils Relative %: 48.8 % (ref 43.0–77.0)
Platelets: 236 K/uL (ref 150.0–400.0)
RBC: 4.37 Mil/uL (ref 3.87–5.11)
RDW: 12.7 % (ref 11.5–15.5)
WBC: 5.6 K/uL (ref 4.0–10.5)

## 2024-08-16 LAB — LIPID PANEL
Cholesterol: 145 mg/dL (ref 0–200)
HDL: 42.1 mg/dL (ref >39.00)
LDL Cholesterol: 85 mg/dL (ref 0–99)
NonHDL: 103.33
Total CHOL/HDL Ratio: 3
Triglycerides: 91 mg/dL (ref 0.0–149.0)
VLDL: 18.2 mg/dL (ref 0.0–40.0)

## 2024-08-16 MED ORDER — SERTRALINE HCL 100 MG PO TABS
100.0000 mg | ORAL_TABLET | Freq: Every evening | ORAL | 3 refills | Status: AC
  Filled 2024-08-16: qty 90, 90d supply, fill #0
  Filled 2024-11-15: qty 90, 90d supply, fill #1

## 2024-08-16 MED ORDER — SUMATRIPTAN SUCCINATE 50 MG PO TABS
50.0000 mg | ORAL_TABLET | Freq: Once | ORAL | 2 refills | Status: AC
  Filled 2024-08-16: qty 10, 10d supply, fill #0
  Filled 2024-10-14: qty 10, 10d supply, fill #1

## 2024-08-16 MED ORDER — SPIRONOLACTONE 50 MG PO TABS
100.0000 mg | ORAL_TABLET | Freq: Every day | ORAL | 3 refills | Status: AC
  Filled 2024-08-16 – 2024-10-14 (×2): qty 180, 90d supply, fill #0

## 2024-08-16 NOTE — Progress Notes (Signed)
 Subjective  Chief Complaint  Patient presents with   Annual Exam    HPI: Sarah Lyons is a 38 y.o. female who presents to [facility] [place] today for a Female Wellness Visit. She also has the concerns and/or needs as listed above in the chief complaint. These will be addressed in addition to the Health Maintenance Visit.   Wellness Visit: annual visit with health maintenance review and exam  HM: mammo current. Pap nl and neg HR HPV last year. Single mom with 2 children, divorced. Works full time. Eligible for flu vaccination Chronic disease f/u and/or acute problem visit: (deemed necessary to be done in addition to the wellness visit): Discussed the use of AI scribe software for clinical note transcription with the patient, who gave verbal consent to proceed.  History of Present Illness Sarah Lyons is a 38 year old female who presents with fatigue, weight gain, and chronic exhaustion.  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 - perimenopausal with mirena  IUD in place  Dermatologic symptoms - Dry skin, particularly on hands and feet - Skin becomes peely and itchy - No current night sweats (previously present but resolved)  Gynecologic and menstrual history - ammenorrheic - IUD in place, effective for another three years - No hot flashes - No changes in libido  Allergic symptoms - Daily use of Singulair  and Xyzal for allergies, along with a nasal spray and fast-acting allergy  medication - Allergies not well-controlled despite current regimen  Acne management - Takes spironolactone  for acne, ongoing for approximately six years  Gastrointestinal symptoms - History of constipation, attributed to low fiber, fruit, and vegetable intake in early years - Currently working on dietary improvements to address constipation   Assessment  1. Encounter for well adult exam with abnormal findings   2. Need for influenza vaccination   3. GAD (generalized anxiety disorder)   4. IUD (intrauterine device) in place   5. Migraine without aura and without status migrainosus, not intractable   6. Chronic allergic rhinitis   7. Acne vulgaris   8. Obesity (BMI 30-39.9)   9. Perimenopausal      Plan  Female Wellness Visit: Age appropriate Health Maintenance and Prevention measures were discussed with patient. Included topics are cancer screening recommendations, ways to keep healthy (see AVS) including dietary and exercise recommendations, regular eye and dental care, use of seat belts, and avoidance of moderate alcohol use and tobacco use.  BMI: discussed patient's BMI and encouraged positive lifestyle modifications to help get to or maintain a target BMI. HM needs and immunizations were addressed and ordered. See below for orders. See HM and immunization section for updates. Routine labs and screening tests ordered including cmp, cbc and lipids where appropriate. Discussed recommendations regarding Vit D and calcium supplementation (see AVS)  Chronic disease management visit and/or acute problem visit: Assessment and Plan Assessment & Plan Fatigue and weight gain Chronic sleep deprivation due to insufficient sleep duration. Potential benefits of GLP-1 medications for weight loss and metabolic regulation discussed. -  Order blood work including thyroid function tests and blood counts - Encourage lifestyle balance and stress management - Advise on improving sleep hygiene and  increasing sleep duration - Discuss potential use of GLP-1 medications for weight loss and metabolic benefits  Depression and generalized anxiety disorder Reports not feeling as well as desired. - Increase Zoloft  dosage temporarily to 100mg  daily  Allergic rhinitis Chronic allergic rhinitis with inadequate control on current regimen. Potential chronic histamine response contributing to fatigue. - Continue Singulair , Xyzal, and nasal spray - Consider referral to allergist for further evaluation and management  Migraine Intermittent migraines reported. - Start migraine medication, Imitrex 50 ordered  Acne vulgaris Chronic acne managed with spironolactone  for approximately six years. - Continue spironolactone  for acne management  Constipation Chronic constipation with low fiber intake. - Discuss gut health interventions and increase fiber intake   Follow up: 12 mo for cpe  Orders Placed This Encounter  Procedures   Flu vaccine trivalent PF, 6mos and older(Flulaval,Afluria,Fluarix,Fluzone)   CBC with Differential/Platelet   Comprehensive metabolic panel with GFR   Lipid panel   TSH   Meds ordered this encounter  Medications   sertraline  (ZOLOFT ) 100 MG tablet    Sig: Take 1 tablet (100 mg total) by mouth at bedtime. Need OV for future refills    Dispense:  90 tablet    Refill:  3    Need OV for future refills      Body mass index is 33.9 kg/m. Wt Readings from Last 3 Encounters:  08/16/24 191 lb 6.4 oz (86.8 kg)  11/20/23 177 lb 6 oz (80.5 kg)  03/25/23 180 lb 9.6 oz (81.9 kg)     Patient Active Problem List   Diagnosis Date Noted   Obesity (BMI 30-39.9) 08/16/2024   Perimenopausal 08/16/2024   HSV-2 seropositive 03/31/2023    Had clinically herpetic lesion and serotype conversion, May 2024    GAD (generalized anxiety disorder) 03/25/2023    Started with divorce 2020; maintained on zoloft     Acne vulgaris 04/25/2021    spironolactone     IUD (intrauterine  device) in place 04/04/2020    Mirena  #2, placed 2020 Dr. Gorge    Migraine without aura 08/30/2007   Chronic allergic rhinitis 07/30/2007    montelukast     Health Maintenance  Topic Date Due   HPV VACCINES (1 - Risk 3-dose SCDM series) Never done   COVID-19 Vaccine (2 - Pfizer risk series) 09/01/2024 (Originally 08/30/2021)   Mammogram  05/20/2026   Cervical Cancer Screening (HPV/Pap Cotest)  03/24/2028   DTaP/Tdap/Td (2 - Td or Tdap) 04/26/2031   Influenza Vaccine  Completed   Hepatitis C Screening  Completed   HIV Screening  Completed   Pneumococcal Vaccine  Aged Out   Meningococcal B Vaccine  Aged Out   Immunization History  Administered Date(s) Administered   Hep B, Unspecified 08/04/1995, 09/07/1995, 02/09/1996   Influenza, Seasonal, Injecte, Preservative Fre 08/16/2024   Influenza,inj,Quad PF,6+ Mos 07/08/2016, 08/04/2018, 08/09/2020   Influenza-Unspecified 07/14/2018, 08/01/2019, 08/17/2021, 07/30/2022   Pfizer Covid-19 Vaccine Bivalent Booster 43yrs & up 08/09/2021   Tdap 04/25/2021   We updated and reviewed the patient's past history in detail and it is documented below. Allergies: Patient is allergic to sulfonamide derivatives. Past Medical History Patient  has a past medical history of Anemia, Anxiety, and Iron deficiency anemia (07/30/2007). Past Surgical History Patient  has a past surgical history that includes Mouth surgery; Cholecystectomy (N/A, 09/05/2015); Breast biopsy (Left, 12/01/2019); and Breast biopsy (Left, 12/01/2019).  Family History: Patient family history includes ADD / ADHD in her son; Alcohol abuse in her maternal uncle; Anxiety disorder in her mother; Arthritis in her paternal grandmother; Birth defects in her maternal grandfather; Breast cancer in her paternal grandmother; Cancer in her maternal grandfather and paternal grandmother; Depression in her mother; Diabetes in her paternal grandfather; Drug abuse in her maternal uncle; Early death in her  maternal uncle; Healthy in her daughter and son; Hearing loss in her paternal grandfather; Heart disease in her maternal grandmother; Hypertension in her father and mother; Obesity in her mother, paternal aunt, and paternal grandmother; Prostate cancer in her father. Social History:  Patient  reports that she has never smoked. She has never used smokeless tobacco. She reports current alcohol use of about 8.0 standard drinks of alcohol per week. She reports that she does not use drugs.  Review of Systems: Constitutional: negative for fever or malaise Ophthalmic: negative for photophobia, double vision or loss of vision Cardiovascular: negative for chest pain, dyspnea on exertion, or new LE swelling Respiratory: negative for SOB or persistent cough Gastrointestinal: negative for abdominal pain, change in bowel habits or melena Genitourinary: negative for dysuria or gross hematuria, no abnormal uterine bleeding or disharge Musculoskeletal: negative for new gait disturbance or muscular weakness Integumentary: negative for new or persistent rashes, no breast lumps Neurological: negative for TIA or stroke symptoms Psychiatric: negative for SI or delusions Allergic/Immunologic: negative for hives  Patient Care Team    Relationship Specialty Notifications Start End  Jodie Lavern CROME, MD PCP - General Family Medicine  04/04/20   Gorge Ade, MD Consulting Physician Obstetrics and Gynecology  05/17/18   Jackquline Sawyer, MD  Dermatology  04/25/21     Objective  Vitals: BP 110/70   Pulse 73   Temp 98.2 F (36.8 C)   Ht 5' 3 (1.6 m)   Wt 191 lb 6.4 oz (86.8 kg)   SpO2 98%   BMI 33.90 kg/m  General:  Well developed, well nourished, no acute distress  Psych:  Alert and orientedx3,normal mood and affect HEENT:  Normocephalic, atraumatic, non-icteric sclera,  supple neck without adenopathy, mass or thyromegaly Cardiovascular:  Normal S1, S2, RRR without gallop, rub or murmur Respiratory:  Good  breath sounds bilaterally, CTAB with normal respiratory effort Gastrointestinal: normal bowel sounds, soft, non-tender, no noted masses. No HSM MSK: extremities without edema, joints without erythema or swelling Neurologic:    Mental status is normal.  Gross motor and sensory exams are normal.  No tremor  Commons side effects, risks, benefits, and alternatives for medications and treatment plan prescribed today were discussed, and the patient expressed understanding of the given instructions. Patient is instructed to call or message via MyChart if he/she has any questions or concerns regarding our treatment plan. No barriers to understanding were identified. We discussed Red Flag symptoms and signs in detail. Patient expressed understanding regarding what to do in case of urgent or emergency type symptoms.  Medication list was reconciled, printed and provided to the patient in AVS. Patient instructions and summary information was reviewed with the patient as documented in the AVS. This note was prepared with assistance of Dragon voice recognition software. Occasional wrong-word or sound-a-like substitutions may have occurred due to the inherent limitations of voice recognition software

## 2024-08-16 NOTE — Patient Instructions (Signed)
 Please return in 12 months for your annual complete physical; please come fasting.   I will release your lab results to you on your MyChart account with further instructions. You may see the results before I do, but when I review them I will send you a message with my report or have my assistant call you if things need to be discussed. Please reply to my message with any questions. Thank you!   If you have any questions or concerns, please don't hesitate to send me a message via MyChart or call the office at [phone]. Thank you for visiting with us  today! It's our pleasure caring for you.    VISIT SUMMARY: Today, we discussed your ongoing issues with fatigue, weight gain, and chronic exhaustion. We also reviewed your dermatologic symptoms, allergic rhinitis, acne management, and gastrointestinal symptoms. Additionally, we addressed your mental health concerns related to depression and generalized anxiety disorder, as well as your experience with migraines.  YOUR PLAN: -FATIGUE AND WEIGHT GAIN: Your fatigue and weight gain may be related to chronic sleep deprivation and insufficient sleep duration. We will conduct blood work, including thyroid function tests and blood counts, to rule out any underlying conditions. You are encouraged to balance your lifestyle, manage stress, improve sleep hygiene, and increase your sleep duration. We also discussed the potential benefits of GLP-1 medications for weight loss and metabolic regulation.  -DEPRESSION AND GENERALIZED ANXIETY DISORDER: You reported not feeling as well as you would like. We have decided to temporarily increase your Zoloft  dosage to help manage your symptoms of depression and anxiety.  -ALLERGIC RHINITIS: Your chronic allergic rhinitis is not well-controlled with your current medications. We will continue your current regimen of Singulair , Xyzal, and nasal spray, but we may refer you to an allergist for further evaluation and  management.  -MIGRAINE: You have been experiencing intermittent migraines. We will start you on a migraine medication to help manage these episodes.  -ACNE VULGARIS: Your chronic acne has been managed with spironolactone  for approximately six years. You should continue taking spironolactone  for acne management.  -CONSTIPATION: Your chronic constipation is likely due to low fiber intake. We discussed gut health interventions, and you should increase your fiber intake to help alleviate this issue.  INSTRUCTIONS: Please follow up with the recommended blood work, including thyroid function tests and blood counts. Consider seeing an allergist for further evaluation of your allergic rhinitis. Continue with your current medications and lifestyle adjustments as discussed. If you have any new symptoms or concerns, please schedule a follow-up appointment.                      Contains text generated by [employer].                                 Contains text generated by [employer].

## 2024-08-17 ENCOUNTER — Ambulatory Visit: Payer: Self-pay | Admitting: Family Medicine

## 2024-08-17 NOTE — Progress Notes (Signed)
 Labs reviewed.  The 10-year ASCVD risk score (Arnett DK, et al., 2019) is: 0.4%   Values used to calculate the score:     Age: 37 years     Clincally relevant sex: Female     Is Non-Hispanic African American: No     Diabetic: No     Tobacco smoker: No     Systolic Blood Pressure: 110 mmHg     Is BP treated: No     HDL Cholesterol: 42.1 mg/dL     Total Cholesterol: 145 mg/dL

## 2024-09-05 ENCOUNTER — Encounter: Payer: Self-pay | Admitting: Radiology

## 2024-09-12 ENCOUNTER — Other Ambulatory Visit (HOSPITAL_BASED_OUTPATIENT_CLINIC_OR_DEPARTMENT_OTHER): Payer: Self-pay

## 2024-09-13 ENCOUNTER — Other Ambulatory Visit (HOSPITAL_BASED_OUTPATIENT_CLINIC_OR_DEPARTMENT_OTHER): Payer: Self-pay

## 2024-09-19 ENCOUNTER — Other Ambulatory Visit (HOSPITAL_BASED_OUTPATIENT_CLINIC_OR_DEPARTMENT_OTHER): Payer: Self-pay

## 2024-10-14 ENCOUNTER — Other Ambulatory Visit: Payer: Self-pay

## 2024-10-14 ENCOUNTER — Other Ambulatory Visit (HOSPITAL_BASED_OUTPATIENT_CLINIC_OR_DEPARTMENT_OTHER): Payer: Self-pay

## 2025-08-17 ENCOUNTER — Encounter: Admitting: Family Medicine
